# Patient Record
Sex: Female | Born: 1990 | Race: Black or African American | Hispanic: No | Marital: Single | Smoking: Never smoker
Health system: Southern US, Community
[De-identification: ages and names within clinical notes are randomized; demographics above are authoritative.]

## PROBLEM LIST (undated history)

## (undated) ENCOUNTER — Inpatient Hospital Stay (HOSPITAL_COMMUNITY): Payer: Self-pay

## (undated) DIAGNOSIS — R519 Headache, unspecified: Secondary | ICD-10-CM

## (undated) DIAGNOSIS — R51 Headache: Secondary | ICD-10-CM

## (undated) DIAGNOSIS — I10 Essential (primary) hypertension: Secondary | ICD-10-CM

## (undated) HISTORY — PX: TOOTH EXTRACTION: SHX859

---
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
## 2012-02-19 NOTE — L&D Delivery Note (Signed)
Whitney Todd is a 22 y.o. G2P1001 at [redacted]w[redacted]d presenting for IOL for GHTN.  She received cytotec, foley bulb and pitocin and progressed to complete dilation.  Delivery Note At 2:59 PM, after pushing through 4 contractions, a viable female was delivered via Vaginal, Spontaneous Delivery (Presentation: Left Occiput Anterior).  APGAR: 8, 9; weight .   Placenta status: Intact, Spontaneous.  Cord: 3 vessels with the following complications: None.  Cord pH: n./a  Anesthesia: Epidural  Episiotomy: n/a Lacerations: none Suture Repair: n/a Est. Blood Loss (mL): 500  Mom to postpartum.  Baby to nursery-stable.  Napoleon Form 06/12/2012, 3:12 PM

## 2012-04-13 ENCOUNTER — Inpatient Hospital Stay (HOSPITAL_COMMUNITY)
Admission: AD | Admit: 2012-04-13 | Discharge: 2012-04-13 | Disposition: A | Discharge status: Home / Self Care | Payer: Self-pay | Source: Ambulatory Visit | Attending: Obstetrics & Gynecology | Admitting: Obstetrics & Gynecology

## 2012-04-13 ENCOUNTER — Encounter (HOSPITAL_COMMUNITY): Payer: Self-pay | Admitting: *Deleted

## 2012-04-13 DIAGNOSIS — O26859 Spotting complicating pregnancy, unspecified trimester: Secondary | ICD-10-CM | POA: Insufficient documentation

## 2012-04-13 DIAGNOSIS — R109 Unspecified abdominal pain: Secondary | ICD-10-CM

## 2012-04-13 DIAGNOSIS — O0930 Supervision of pregnancy with insufficient antenatal care, unspecified trimester: Secondary | ICD-10-CM

## 2012-04-13 DIAGNOSIS — O9989 Other specified diseases and conditions complicating pregnancy, childbirth and the puerperium: Secondary | ICD-10-CM

## 2012-04-13 DIAGNOSIS — O26899 Other specified pregnancy related conditions, unspecified trimester: Secondary | ICD-10-CM

## 2012-04-13 DIAGNOSIS — Z3480 Encounter for supervision of other normal pregnancy, unspecified trimester: Secondary | ICD-10-CM

## 2012-04-13 LAB — URINALYSIS, ROUTINE W REFLEX MICROSCOPIC
Ketones, ur: NEGATIVE mg/dL
Nitrite: NEGATIVE
Protein, ur: NEGATIVE mg/dL
Urobilinogen, UA: 1 mg/dL (ref 0.0–1.0)
pH: 7 (ref 5.0–8.0)

## 2012-04-13 MED ORDER — RANITIDINE HCL 150 MG PO TABS: 150.0000 mg | ORAL_TABLET | Freq: Two times a day (BID) | ORAL | Status: DC

## 2012-04-13 NOTE — MAU Provider Note (Signed)
History     CSN: [id number]  Arrival date and time: 04/13/12 1746   None     Chief Complaint  Patient presents with  . Possible Pregnancy  . Abdominal Pain   HPI 22 y.o. G2P1001 at [redacted]w[redacted]d. Irregular periods x 4 months, upper abd pain x 4 months. Spotting since August, had some spotting early this month x  1-2 days. No spotting in a few weeks, no pain today. Says she did not know she was pregnant.    History reviewed. No pertinent past medical history.  History reviewed. No pertinent past surgical history.  Family History  Problem Relation Age of Onset  . Hypertension Mother   . Diabetes Father   . Diabetes Paternal Grandmother     History  Substance Use Topics  . Smoking status: Never Smoker   . Smokeless tobacco: Not on file  . Alcohol Use: No    Allergies: No Known Allergies  Prescriptions prior to admission  Medication Sig Dispense Refill  . acetaminophen (TYLENOL) 500 MG tablet Take 1,000 mg by mouth every 6 (six) hours as needed. For pain      . cyclobenzaprine (FLEXERIL) 10 MG tablet Take 1 tablet (10 mg total) by mouth 2 (two) times daily as needed for muscle spasms.  20 tablet  0    Review of Systems  Constitutional: Negative.   Respiratory: Negative.   Cardiovascular: Negative.   Gastrointestinal: Positive for abdominal pain (upper). Negative for nausea, vomiting, diarrhea and constipation.  Genitourinary: Negative for dysuria, urgency, frequency, hematuria and flank pain.       Negative for vaginal bleeding, cramping/contractions  Musculoskeletal: Negative.   Neurological: Negative.   Psychiatric/Behavioral: Negative.    Physical Exam   Blood pressure 138/96, pulse 89, resp. rate 20, height 5\' 10"  (1.778 m), weight 249 lb 6.4 oz (113.127 kg), last menstrual period 09/22/2011, SpO2 100.00%.  Physical Exam  Nursing note and vitals reviewed. Constitutional: She is oriented to person, place, and time. She appears well-developed and well-nourished. No  distress.  Cardiovascular: Normal rate.   Respiratory: Effort normal.  GI: Soft. She exhibits no mass. There is no tenderness. There is no rebound and no guarding.  Fundal height - 28 weeks   Musculoskeletal: Normal range of motion.  Neurological: She is alert and oriented to person, place, and time.  Skin: Skin is warm and dry.  Psychiatric: She has a normal mood and affect.    MAU Course  Procedures Results for orders placed during the hospital encounter of 04/13/12 (from the past 24 hour(s))  URINALYSIS, ROUTINE W REFLEX MICROSCOPIC     Status: Abnormal   Collection Time    04/13/12  6:55 PM      Result Value Range   Color, Urine YELLOW  YELLOW   APPearance CLEAR  CLEAR   Specific Gravity, Urine 1.015  1.005 - 1.030   pH 7.0  5.0 - 8.0   Glucose, UA NEGATIVE  NEGATIVE mg/dL   Hgb urine dipstick NEGATIVE  NEGATIVE   Bilirubin Urine NEGATIVE  NEGATIVE   Ketones, ur NEGATIVE  NEGATIVE mg/dL   Protein, ur NEGATIVE  NEGATIVE mg/dL   Urobilinogen, UA 1.0  0.0 - 1.0 mg/dL   Nitrite NEGATIVE  NEGATIVE   Leukocytes, UA MODERATE (*) NEGATIVE  URINE MICROSCOPIC-ADD ON     Status: Abnormal   Collection Time    04/13/12  6:55 PM      Result Value Range   Squamous Epithelial / LPF FEW (*) RARE  WBC, UA 3-6  <3 WBC/hpf   RBC / HPF 3-6  <3 RBC/hpf   Bacteria, UA FEW (*) RARE  POCT PREGNANCY, URINE     Status: Abnormal   Collection Time    04/13/12  7:08 PM      Result Value Range   Preg Test, Ur POSITIVE (*) NEGATIVE   Urine culture sent  Assessment and Plan  22 y.o. G2P1001 at [redacted]w[redacted]d H/O spotting this pregnancy - none x a few weeks Offered limited u/s during MAU stay, but patient opted for outpatient u/s d/t long wait Precautions rev'd Detailed u/s ordered Message sent to clinic to schedule New OB  FRAZIER,NATALIE 04/13/2012, 9:15 PM

## 2012-04-13 NOTE — MAU Note (Signed)
Patient states she has had irregular periods for about 4 months. Has been having upper abdominal pain for the same 4 months but patient states not related to periods. Denies any bleeding or discharge.

## 2012-04-13 NOTE — MAU Note (Signed)
Pt presents with abdominal pain that started approximately 4months ago.  Last normal period was in August.  Since then she has been having some light spotting since then.

## 2012-04-14 LAB — URINE CULTURE: Colony Count: 6000

## 2012-04-16 ENCOUNTER — Ambulatory Visit (HOSPITAL_COMMUNITY)
Admission: RE | Admit: 2012-04-16 | Discharge: 2012-04-16 | Disposition: A | Discharge status: Home / Self Care | Payer: Self-pay | Source: Ambulatory Visit | Attending: Advanced Practice Midwife | Admitting: Advanced Practice Midwife

## 2012-04-16 DIAGNOSIS — Z363 Encounter for antenatal screening for malformations: Secondary | ICD-10-CM | POA: Insufficient documentation

## 2012-04-16 DIAGNOSIS — Z1389 Encounter for screening for other disorder: Secondary | ICD-10-CM | POA: Insufficient documentation

## 2012-04-16 DIAGNOSIS — O0930 Supervision of pregnancy with insufficient antenatal care, unspecified trimester: Secondary | ICD-10-CM | POA: Insufficient documentation

## 2012-04-16 DIAGNOSIS — O358XX0 Maternal care for other (suspected) fetal abnormality and damage, not applicable or unspecified: Secondary | ICD-10-CM | POA: Insufficient documentation

## 2012-04-16 DIAGNOSIS — R109 Unspecified abdominal pain: Secondary | ICD-10-CM

## 2012-04-16 DIAGNOSIS — O209 Hemorrhage in early pregnancy, unspecified: Secondary | ICD-10-CM | POA: Insufficient documentation

## 2012-04-17 DIAGNOSIS — Z3480 Encounter for supervision of other normal pregnancy, unspecified trimester: Secondary | ICD-10-CM

## 2012-04-17 DIAGNOSIS — O0930 Supervision of pregnancy with insufficient antenatal care, unspecified trimester: Secondary | ICD-10-CM

## 2012-04-27 ENCOUNTER — Ambulatory Visit (INDEPENDENT_AMBULATORY_CARE_PROVIDER_SITE_OTHER): Payer: Self-pay | Admitting: Advanced Practice Midwife

## 2012-04-27 ENCOUNTER — Other Ambulatory Visit: Payer: Self-pay | Admitting: Advanced Practice Midwife

## 2012-04-27 ENCOUNTER — Encounter: Payer: Self-pay | Admitting: Family Medicine

## 2012-04-27 VITALS — BP 151/94 | Temp 97.2°F | Wt 247.0 lb

## 2012-04-27 DIAGNOSIS — O09293 Supervision of pregnancy with other poor reproductive or obstetric history, third trimester: Secondary | ICD-10-CM

## 2012-04-27 DIAGNOSIS — O98319 Other infections with a predominantly sexual mode of transmission complicating pregnancy, unspecified trimester: Secondary | ICD-10-CM

## 2012-04-27 DIAGNOSIS — A749 Chlamydial infection, unspecified: Secondary | ICD-10-CM

## 2012-04-27 DIAGNOSIS — O09299 Supervision of pregnancy with other poor reproductive or obstetric history, unspecified trimester: Secondary | ICD-10-CM | POA: Insufficient documentation

## 2012-04-27 DIAGNOSIS — O0933 Supervision of pregnancy with insufficient antenatal care, third trimester: Secondary | ICD-10-CM

## 2012-04-27 DIAGNOSIS — O0930 Supervision of pregnancy with insufficient antenatal care, unspecified trimester: Secondary | ICD-10-CM

## 2012-04-27 LAB — POCT URINALYSIS DIP (DEVICE)
Bilirubin Urine: NEGATIVE
Glucose, UA: NEGATIVE mg/dL
Hgb urine dipstick: NEGATIVE
Ketones, ur: NEGATIVE mg/dL
Nitrite: NEGATIVE
Specific Gravity, Urine: 1.025 (ref 1.005–1.030)
pH: 7 (ref 5.0–8.0)

## 2012-04-27 NOTE — Patient Instructions (Addendum)
Pregnancy - Third Trimester The third trimester of pregnancy (the last 3 months) is a period of the most rapid growth for you and your baby. The baby approaches a length of 20 inches and a weight of 6 to 10 pounds. The baby is adding on fat and getting ready for life outside your body. While inside, babies have periods of sleeping and waking, suck their thumbs, and hiccups. You can often feel small contractions of the uterus. This is false labor. It is also called Braxton-Hicks contractions. This is like a practice for labor. The usual problems in this stage of pregnancy include more difficulty breathing, swelling of the hands and feet from water retention, and having to urinate more often because of the uterus and baby pressing on your bladder.  PRENATAL EXAMS  Blood work may continue to be done during prenatal exams. These tests are done to check on your health and the probable health of your baby. Blood work is used to follow your blood levels (hemoglobin). Anemia (low hemoglobin) is common during pregnancy. Iron and vitamins are given to help prevent this. You may also continue to be checked for diabetes. Some of the past blood tests may be done again.  The size of the uterus is measured during each visit. This makes sure your baby is growing properly according to your pregnancy dates.  Your blood pressure is checked every prenatal visit. This is to make sure you are not getting toxemia.  Your urine is checked every prenatal visit for infection, diabetes and protein.  Your weight is checked at each visit. This is done to make sure gains are happening at the suggested rate and that you and your baby are growing normally.  Sometimes, an ultrasound is performed to confirm the position and the proper growth and development of the baby. This is a test done that bounces harmless sound waves off the baby so your caregiver can more accurately determine due dates.  Discuss the type of pain medication and  anesthesia you will have during your labor and delivery.  Discuss the possibility and anesthesia if a Cesarean Section might be necessary.  Inform your caregiver if there is any mental or physical violence at home. Sometimes, a specialized non-stress test, contraction stress test and biophysical profile are done to make sure the baby is not having a problem. Checking the amniotic fluid surrounding the baby is called an amniocentesis. The amniotic fluid is removed by sticking a needle into the belly (abdomen). This is sometimes done near the end of pregnancy if an early delivery is required. In this case, it is done to help make sure the baby's lungs are mature enough for the baby to live outside of the womb. If the lungs are not mature and it is unsafe to deliver the baby, an injection of cortisone medication is given to the mother 1 to 2 days before the delivery. This helps the baby's lungs mature and makes it safer to deliver the baby. CHANGES OCCURING IN THE THIRD TRIMESTER OF PREGNANCY Your body goes through many changes during pregnancy. They vary from person to person. Talk to your caregiver about changes you notice and are concerned about.  During the last trimester, you have probably had an increase in your appetite. It is normal to have cravings for certain foods. This varies from person to person and pregnancy to pregnancy.  You may begin to get stretch marks on your hips, abdomen, and breasts. These are normal changes in the body   during pregnancy. There are no exercises or medications to take which prevent this change.  Constipation may be treated with a stool softener or adding bulk to your diet. Drinking lots of fluids, fiber in vegetables, fruits, and whole grains are helpful.  Exercising is also helpful. If you have been very active up until your pregnancy, most of these activities can be continued during your pregnancy. If you have been less active, it is helpful to start an exercise  program such as walking. Consult your caregiver before starting exercise programs.  Avoid all smoking, alcohol, un-prescribed drugs, herbs and "street drugs" during your pregnancy. These chemicals affect the formation and growth of the baby. Avoid chemicals throughout the pregnancy to ensure the delivery of a healthy infant.  Backache, varicose veins and hemorrhoids may develop or get worse.  You will tire more easily in the third trimester, which is normal.  The baby's movements may be stronger and more often.  You may become short of breath easily.  Your belly button may stick out.  A yellow discharge may leak from your breasts called colostrum.  You may have a bloody mucus discharge. This usually occurs a few days to a week before labor begins. HOME CARE INSTRUCTIONS   Keep your caregiver's appointments. Follow your caregiver's instructions regarding medication use, exercise, and diet.  During pregnancy, you are providing food for you and your baby. Continue to eat regular, well-balanced meals. Choose foods such as meat, fish, milk and other low fat dairy products, vegetables, fruits, and whole-grain breads and cereals. Your caregiver will tell you of the ideal weight gain.  A physical sexual relationship may be continued throughout pregnancy if there are no other problems such as early (premature) leaking of amniotic fluid from the membranes, vaginal bleeding, or belly (abdominal) pain.  Exercise regularly if there are no restrictions. Check with your caregiver if you are unsure of the safety of your exercises. Greater weight gain will occur in the last 2 trimesters of pregnancy. Exercising helps:  Control your weight.  Get you in shape for labor and delivery.  You lose weight after you deliver.  Rest a lot with legs elevated, or as needed for leg cramps or low back pain.  Wear a good support or jogging bra for breast tenderness during pregnancy. This may help if worn during  sleep. Pads or tissues may be used in the bra if you are leaking colostrum.  Do not use hot tubs, steam rooms, or saunas.  Wear your seat belt when driving. This protects you and your baby if you are in an accident.  Avoid raw meat, cat litter boxes and soil used by cats. These carry germs that can cause birth defects in the baby.  It is easier to loose urine during pregnancy. Tightening up and strengthening the pelvic muscles will help with this problem. You can practice stopping your urination while you are going to the bathroom. These are the same muscles you need to strengthen. It is also the muscles you would use if you were trying to stop from passing gas. You can practice tightening these muscles up 10 times a set and repeating this about 3 times per day. Once you know what muscles to tighten up, do not perform these exercises during urination. It is more likely to cause an infection by backing up the urine.  Ask for help if you have financial, counseling or nutritional needs during pregnancy. Your caregiver will be able to offer counseling for these   needs as well as refer you for other special needs.  Make a list of emergency phone numbers and have them available.  Plan on getting help from family or friends when you go home from the hospital.  Make a trial run to the hospital.  Take prenatal classes with the father to understand, practice and ask questions about the labor and delivery.  Prepare the baby's room/nursery.  Do not travel out of the city unless it is absolutely necessary and with the advice of your caregiver.  Wear only low or no heal shoes to have better balance and prevent falling. MEDICATIONS AND DRUG USE IN PREGNANCY  Take prenatal vitamins as directed. The vitamin should contain 1 milligram of folic acid. Keep all vitamins out of reach of children. Only a couple vitamins or tablets containing iron may be fatal to a baby or young child when ingested.  Avoid use  of all medications, including herbs, over-the-counter medications, not prescribed or suggested by your caregiver. Only take over-the-counter or prescription medicines for pain, discomfort, or fever as directed by your caregiver. Do not use aspirin, ibuprofen (Motrin, Advil, Nuprin) or naproxen (Aleve) unless OK'd by your caregiver.  Let your caregiver also know about herbs you may be using.  Alcohol is related to a number of birth defects. This includes fetal alcohol syndrome. All alcohol, in any form, should be avoided completely. Smoking will cause low birth rate and premature babies.  Street/illegal drugs are very harmful to the baby. They are absolutely forbidden. A baby born to an addicted mother will be addicted at birth. The baby will go through the same withdrawal an adult does. SEEK MEDICAL CARE IF: You have any concerns or worries during your pregnancy. It is better to call with your questions if you feel they cannot wait, rather than worry about them. DECISIONS ABOUT CIRCUMCISION You may or may not know the sex of your baby. If you know your baby is a boy, it may be time to think about circumcision. Circumcision is the removal of the foreskin of the penis. This is the skin that covers the sensitive end of the penis. There is no proven medical need for this. Often this decision is made on what is popular at the time or based upon religious beliefs and social issues. You can discuss these issues with your caregiver or pediatrician. SEEK IMMEDIATE MEDICAL CARE IF:   An unexplained oral temperature above 102 F (38.9 C) develops, or as your caregiver suggests.  You have leaking of fluid from the vagina (birth canal). If leaking membranes are suspected, take your temperature and tell your caregiver of this when you call.  There is vaginal spotting, bleeding or passing clots. Tell your caregiver of the amount and how many pads are used.  You develop a bad smelling vaginal discharge with  a change in the color from clear to white.  You develop vomiting that lasts more than 24 hours.  You develop chills or fever.  You develop shortness of breath.  You develop burning on urination.  You loose more than 2 pounds of weight or gain more than 2 pounds of weight or as suggested by your caregiver.  You notice sudden swelling of your face, hands, and feet or legs.  You develop belly (abdominal) pain. Round ligament discomfort is a common non-cancerous (benign) cause of abdominal pain in pregnancy. Your caregiver still must evaluate you.  You develop a severe headache that does not go away.  You develop visual   problems, blurred or double vision.  If you have not felt your baby move for more than 1 hour. If you think the baby is not moving as much as usual, eat something with sugar in it and lie down on your left side for an hour. The baby should move at least 4 to 5 times per hour. Call right away if your baby moves less than that.  You fall, are in a car accident or any kind of trauma.  There is mental or physical violence at home. Document Released: 01/29/2001 Document Revised: 04/29/2011 Document Reviewed: 08/03/2008 [employer] Patient Information 2013 [employer].  Hypertension During Pregnancy Hypertension is also called high blood pressure. It can occur at any time in life and during pregnancy. When you have hypertension, there is extra pressure inside your blood vessels that carry blood from the heart to the rest of your body (arteries). Hypertension during pregnancy can cause problems for you and your baby. Your baby might not weigh as much as it should at birth or might be born early (premature). Very bad cases of hypertension during pregnancy can be life-threatening.  There are different types of hypertension during pregnancy.   Chronic hypertension. This happens when a woman has hypertension before pregnancy and it continues during pregnancy.  Gestational  hypertension. This is when hypertension develops during pregnancy.  Preeclampsia or toxemia of pregnancy. This is a very serious type of hypertension that develops only during pregnancy. It is a disease that affects the whole body (systemic) and can be very dangerous for both mother and baby.  Gestational hypertension and preeclampsia usually go away after your baby is born. Blood pressure generally stabilizes within 6 weeks. Women who have hypertension during pregnancy have a greater chance of developing hypertension later in life or with future pregnancies. UNDERSTANDING BLOOD PRESSURE Blood pressure moves blood in your body. Sometimes, the force that moves the blood becomes too strong.  A blood pressure reading is given in 2 numbers and looks like a fraction.  The top number is called the systolic pressure. When your heart beats, it forces more blood to flow through the arteries. Pressure inside the arteries goes up.  The bottom number is the diastolic pressure. Pressure goes down between beats. That is when the heart is resting.  You may have hypertension if:  Your systolic blood pressure is above 140.  Your diastolic pressure is above 90. RISK FACTORS Some factors make you more likely to develop hypertension during pregnancy. Risk factors include:  Having hypertension before pregnancy.  Having hypertension during a previous pregnancy.  Being overweight.  Being older than 40.  Being pregnant with more than 1 baby (multiples).  Having diabetes or kidney problems. SYMPTOMS Chronic and gestational hypertension may not cause symptoms. Preeclampsia has symptoms, which may include:  Increased protein in your urine. Your caregiver will check for this at every prenatal visit.  Swelling of your hands and face.  Rapid weight gain.  Headaches.  Visual changes.  Being bothered by light.  Abdominal pain, especially in the right upper area.  Chest pain.  Shortness of  breath.  Increased reflexes.  Seizures. Seizures occur with a more severe form of preeclampsia, called eclampsia. DIAGNOSIS   You may be diagnosed with hypertension during pregnancy during a regular prenatal exam. At each visit, tests may include:  Blood pressure checks.  A urine test to check for protein in your urine.  The type of hypertension you are diagnosed with depends on when you developed   it. It also depends on your specific blood pressure reading.  Developing hypertension before 20 weeks of pregnancy is consistent with chronic hypertension.  Developing hypertension after 20 weeks of pregnancy is consistent with gestational hypertension.  Hypertension with increased urinary protein is diagnosed as preeclampsia.  Blood pressure measurements that stay above 160 systolic or 110 diastolic are a sign of severe preeclampsia. TREATMENT Treatment for hypertension during pregnancy varies. Treatment depends on the type of hypertension and how serious it is.  If you take medicine for chronic hypertension, you may need to switch medicines.  Drugs called ACE inhibitors should not be taken during pregnancy.  Low-dose aspirin may be suggested for women who have risk factors for preeclampsia.  If you have gestational hypertension, you may need to take a blood pressure medicine that is safe during pregnancy. Your caregiver will recommend the appropriate medicine.  If you have severe preeclampsia, you may need to be in the hospital. Caregivers will watch you and the baby very closely. You also may need to take medicine (magnesium sulfate) to prevent seizures and lower blood pressure.  Sometimes an early delivery is needed. This may be the case if the condition worsens. It would be done to protect you and the baby. The only cure for preeclampsia is delivery. HOME CARE INSTRUCTIONS  Schedule and keep all of your regular prenatal care.  Follow your caregiver's instructions for taking  medicines. Tell your caregiver about all medicines you take. This includes over-the-counter medicines.  Eat as little salt as possible.  Get regular exercise.  Do not drink alcohol.  Do not use tobacco products.  Do not drink products with caffeine.  Lie on your left side when resting.  Tell your doctor if you have any preeclampsia symptoms. SEEK IMMEDIATE MEDICAL CARE IF:  You have severe abdominal pain.  You have sudden swelling in the hands, ankles, or face.  You gain 4 pounds (1.8 kg) or more in 1 week.  You vomit repeatedly.  You have vaginal bleeding.  You do not feel the baby moving as much.  You have a headache.  You have blurred or double vision.  You have muscle twitching or spasms.  You have shortness of breath.  You have blue fingernails and lips.  You have blood in your urine. MAKE SURE YOU:  Understand these instructions.  Will watch your condition.  Will get help right away if you are not doing well. Document Released: 10/23/2010 Document Revised: 04/29/2011 Document Reviewed: 10/23/2010 [employer] Patient Information 2013 [employer].  

## 2012-04-27 NOTE — Progress Notes (Signed)
Pulse 89 2nd BP 137/88

## 2012-04-27 NOTE — Progress Notes (Signed)
New OB today. See New OB note.  Hypertension noted today. PIH workup.  Subjective:    Whitney Todd is a G2P1001 [redacted]w[redacted]d being seen today for her first obstetrical visit.  Her obstetrical history is significant for obesity, pregnancy induced hypertension and late to care. Patient does intend to breast feed. Pregnancy history fully reviewed.  Patient reports no complaints and Denies headache or visual changes. Denies swelling except in toes at end of day.  Filed Vitals:   04/27/12 0818  BP: 151/94  Temp: 97.2 F (36.2 C)  Weight: 247 lb (112.038 kg)    HISTORY: OB History   Grav Para Term Preterm Abortions TAB SAB Ect Mult Living   2 1 1       1      # Outc Date GA Lbr Len/2nd Wgt Sex Del Anes PTL Lv   1 TRM 12/11   7lb12oz(3.515kg) F SVD EPI No    2 CUR              History reviewed. No pertinent past medical history. History reviewed. No pertinent past surgical history. Family History  Problem Relation Age of Onset  . Hypertension Mother   . Diabetes Father   . Diabetes Paternal Grandmother      Exam    Uterus:  Fundal Height: 31 cm  Pelvic Exam:    Perineum: No Hemorrhoids   Vulva: Bartholin's, Urethra, Skene's normal   Vagina:  normal mucosa, normal discharge   pH:    Cervix: multiparous appearance, no cervical motion tenderness and no lesions   Adnexa: normal adnexa and no mass, fullness, tenderness   Bony Pelvis: gynecoid  System: Breast:  normal appearance, no masses or tenderness   Skin: normal coloration and turgor, no rashes    Neurologic: oriented, no focal deficits, normal mood   Extremities: normal strength, tone, and muscle mass, no deformities   HEENT n/a   Mouth/Teeth mucous membranes moist, pharynx normal without lesions   Neck supple and no masses   Cardiovascular: regular rate and rhythm, no murmurs or gallops   Respiratory:  appears well, vitals normal, no respiratory distress, acyanotic, normal RR, ear and throat exam is normal, neck  free of mass or lymphadenopathy, chest clear, no wheezing, crepitations, rhonchi, normal symmetric air entry   Abdomen: soft, non-tender; bowel sounds normal; no masses,  no organomegaly   Urinary: urethral meatus normal   Fetal heart rate regular 136 bpm Fundal height c/w dates.   No edema  Assessment:    Pregnancy: G2P1001 Patient Active Problem List  Diagnosis  . Supervision of other normal pregnancy  . Insufficient prenatal care  . Hx of preeclampsia, prior pregnancy, currently pregnant  History of Chlamydia first pregnancy      Plan:     Initial labs drawn. Prenatal vitamins. Problem list reviewed and updated. Genetic Screening discussed First Screen: Too Late.  Ultrasound discussed; fetal survey: results reviewed.  Follow up in 1 weeks. 50% of 30 min visit spent on counseling and coordination of care.  Discussed signs of preeclampsia.  Reviewed how to come to get evaluated if she develops signs/sx.  Will get 24 hr urine and PIH labs with NOB labs today.    [facility] 04/27/2012

## 2012-04-27 NOTE — Addendum Note (Signed)
Addended by: Franchot Mimes on: 04/27/2012 09:16 AM   Modules accepted: Orders

## 2012-04-28 LAB — OBSTETRIC PANEL
Eosinophils Absolute: 0 10*3/uL (ref 0.0–0.7)
HCT: 27.9 % — ABNORMAL LOW (ref 36.0–46.0)
Hemoglobin: 9.3 g/dL — ABNORMAL LOW (ref 12.0–15.0)
Lymphs Abs: 2.5 10*3/uL (ref 0.7–4.0)
MCH: 30 pg (ref 26.0–34.0)
MCV: 90 fL (ref 78.0–100.0)
Monocytes Absolute: 0.5 10*3/uL (ref 0.1–1.0)
Monocytes Relative: 4 % (ref 3–12)
Neutrophils Relative %: 75 % (ref 43–77)
RBC: 3.1 MIL/uL — ABNORMAL LOW (ref 3.87–5.11)
Rubella: 1.66 Index — ABNORMAL HIGH (ref <0.90)

## 2012-04-28 LAB — GLUCOSE TOLERANCE, 1 HOUR (50G) W/O FASTING: Glucose, 1 Hour GTT: 107 mg/dL (ref 70–140)

## 2012-04-28 LAB — HIV ANTIBODY (ROUTINE TESTING W REFLEX): HIV: NONREACTIVE

## 2012-04-30 DIAGNOSIS — A749 Chlamydial infection, unspecified: Secondary | ICD-10-CM | POA: Insufficient documentation

## 2012-04-30 MED ORDER — METRONIDAZOLE 500 MG PO TABS: 500.0000 mg | ORAL_TABLET | Freq: Two times a day (BID) | ORAL | Status: DC

## 2012-04-30 MED ORDER — AZITHROMYCIN 1 G PO PACK: 1.0000 | PACK | Freq: Once | ORAL | Status: DC

## 2012-04-30 NOTE — Addendum Note (Signed)
Addended by: Aviva Signs on: 04/30/2012 03:07 PM   Modules accepted: Orders

## 2012-05-01 ENCOUNTER — Telehealth: Payer: Self-pay | Admitting: *Deleted

## 2012-05-01 NOTE — Telephone Encounter (Signed)
Message copied by Jill Side on Fri May 01, 2012 11:14 AM ------      Message from: [place], [place] L      Created: Thu Apr 30, 2012  3:04 PM      Regarding: Please add BV med       Sorry did not see this result too.            Will put in order for Flagyl for BV            Thanks ------

## 2012-05-01 NOTE — Telephone Encounter (Signed)
Called pt and informed her of +Chlamydia and BV which require Rx treatment. She stated that she has not had sex since Dec. 2013. I explained that she may have had the Chlamydia infection since that time. She will need to inform her partner so that he can be tested and treated. She should not have intercourse for 1 week after her treatment to guard against transmission of the infection.  Pt voiced understanding of all information and instructions and will obtain her medications from the pharmacy. STI form completed and faxed to [facility].

## 2012-05-01 NOTE — Telephone Encounter (Signed)
Message copied by Jill Side on Fri May 01, 2012 11:14 AM ------      Message from: [place], [place] L      Created: Thu Apr 30, 2012  3:03 PM      Regarding: Needs Chlam treatment       + Chlamydia at new ob visit            I put in order for Zithromax.            Can you let her know?      Thanks ------

## 2012-05-07 ENCOUNTER — Ambulatory Visit (INDEPENDENT_AMBULATORY_CARE_PROVIDER_SITE_OTHER): Payer: Self-pay | Admitting: Family

## 2012-05-07 ENCOUNTER — Other Ambulatory Visit: Payer: Self-pay | Admitting: Family

## 2012-05-07 VITALS — BP 134/88 | Temp 97.1°F | Wt 251.1 lb

## 2012-05-07 DIAGNOSIS — D649 Anemia, unspecified: Secondary | ICD-10-CM

## 2012-05-07 DIAGNOSIS — O0930 Supervision of pregnancy with insufficient antenatal care, unspecified trimester: Secondary | ICD-10-CM

## 2012-05-07 LAB — POCT URINALYSIS DIP (DEVICE)
Bilirubin Urine: NEGATIVE
Glucose, UA: NEGATIVE mg/dL
Hgb urine dipstick: NEGATIVE
Nitrite: NEGATIVE

## 2012-05-07 MED ORDER — INTEGRA F 125-1 MG PO CAPS: 1.0000 | ORAL_CAPSULE | Freq: Every day | ORAL | Status: DC

## 2012-05-07 NOTE — Addendum Note (Signed)
Addended by: Melissa Noon on: 05/07/2012 10:17 AM   Modules accepted: Orders

## 2012-05-07 NOTE — Progress Notes (Signed)
Pulse- 93  Edema-toes

## 2012-05-07 NOTE — Progress Notes (Signed)
Urine sent for culture

## 2012-05-07 NOTE — Progress Notes (Signed)
No problems or concerns; reviewed lab results. Obtain 24 hr urine and CMP as baseline due to history of preeclampsia.RX for Integra for anemia.

## 2012-05-08 LAB — CULTURE, OB URINE: Colony Count: >=100000

## 2012-05-11 ENCOUNTER — Other Ambulatory Visit: Payer: Self-pay

## 2012-05-21 ENCOUNTER — Other Ambulatory Visit: Payer: Self-pay | Admitting: Family Medicine

## 2012-05-21 ENCOUNTER — Ambulatory Visit (INDEPENDENT_AMBULATORY_CARE_PROVIDER_SITE_OTHER): Payer: Self-pay | Admitting: Family Medicine

## 2012-05-21 ENCOUNTER — Encounter: Payer: Self-pay | Admitting: Family Medicine

## 2012-05-21 VITALS — BP 138/90 | Temp 96.9°F | Wt 249.6 lb

## 2012-05-21 DIAGNOSIS — O0930 Supervision of pregnancy with insufficient antenatal care, unspecified trimester: Secondary | ICD-10-CM

## 2012-05-21 DIAGNOSIS — R8281 Pyuria: Secondary | ICD-10-CM | POA: Insufficient documentation

## 2012-05-21 DIAGNOSIS — R82998 Other abnormal findings in urine: Secondary | ICD-10-CM

## 2012-05-21 LAB — POCT URINALYSIS DIP (DEVICE)
Nitrite: POSITIVE — AB
Protein, ur: 100 mg/dL — AB
Urobilinogen, UA: 4 mg/dL — ABNORMAL HIGH (ref 0.0–1.0)
pH: 7 (ref 5.0–8.0)

## 2012-05-21 NOTE — Patient Instructions (Signed)
Pregnancy - Third Trimester The third trimester of pregnancy (the last 3 months) is a period of the most rapid growth for you and your baby. The baby approaches a length of 20 inches and a weight of 6 to 10 pounds. The baby is adding on fat and getting ready for life outside your body. While inside, babies have periods of sleeping and waking, suck their thumbs, and hiccups. You can often feel small contractions of the uterus. This is false labor. It is also called Braxton-Hicks contractions. This is like a practice for labor. The usual problems in this stage of pregnancy include more difficulty breathing, swelling of the hands and feet from water retention, and having to urinate more often because of the uterus and baby pressing on your bladder.  PRENATAL EXAMS  Blood work may continue to be done during prenatal exams. These tests are done to check on your health and the probable health of your baby. Blood work is used to follow your blood levels (hemoglobin). Anemia (low hemoglobin) is common during pregnancy. Iron and vitamins are given to help prevent this. You may also continue to be checked for diabetes. Some of the past blood tests may be done again.  The size of the uterus is measured during each visit. This makes sure your baby is growing properly according to your pregnancy dates.  Your blood pressure is checked every prenatal visit. This is to make sure you are not getting toxemia.  Your urine is checked every prenatal visit for infection, diabetes and protein.  Your weight is checked at each visit. This is done to make sure gains are happening at the suggested rate and that you and your baby are growing normally.  Sometimes, an ultrasound is performed to confirm the position and the proper growth and development of the baby. This is a test done that bounces harmless sound waves off the baby so your caregiver can more accurately determine due dates.  Discuss the type of pain medication  and anesthesia you will have during your labor and delivery.  Discuss the possibility and anesthesia if a Cesarean Section might be necessary.  Inform your caregiver if there is any mental or physical violence at home. Sometimes, a specialized non-stress test, contraction stress test and biophysical profile are done to make sure the baby is not having a problem. Checking the amniotic fluid surrounding the baby is called an amniocentesis. The amniotic fluid is removed by sticking a needle into the belly (abdomen). This is sometimes done near the end of pregnancy if an early delivery is required. In this case, it is done to help make sure the baby's lungs are mature enough for the baby to live outside of the womb. If the lungs are not mature and it is unsafe to deliver the baby, an injection of cortisone medication is given to the mother 1 to 2 days before the delivery. This helps the baby's lungs mature and makes it safer to deliver the baby. CHANGES OCCURING IN THE THIRD TRIMESTER OF PREGNANCY Your body goes through many changes during pregnancy. They vary from person to person. Talk to your caregiver about changes you notice and are concerned about.  During the last trimester, you have probably had an increase in your appetite. It is normal to have cravings for certain foods. This varies from person to person and pregnancy to pregnancy.  You may begin to get stretch marks on your hips, abdomen, and breasts. These are normal changes in the body   during pregnancy. There are no exercises or medications to take which prevent this change.  Constipation may be treated with a stool softener or adding bulk to your diet. Drinking lots of fluids, fiber in vegetables, fruits, and whole grains are helpful.  Exercising is also helpful. If you have been very active up until your pregnancy, most of these activities can be continued during your pregnancy. If you have been less active, it is helpful to start an  exercise program such as walking. Consult your caregiver before starting exercise programs.  Avoid all smoking, alcohol, un-prescribed drugs, herbs and "street drugs" during your pregnancy. These chemicals affect the formation and growth of the baby. Avoid chemicals throughout the pregnancy to ensure the delivery of a healthy infant.  Backache, varicose veins and hemorrhoids may develop or get worse.  You will tire more easily in the third trimester, which is normal.  The baby's movements may be stronger and more often.  You may become short of breath easily.  Your belly button may stick out.  A yellow discharge may leak from your breasts called colostrum.  You may have a bloody mucus discharge. This usually occurs a few days to a week before labor begins. HOME CARE INSTRUCTIONS   Keep your caregiver's appointments. Follow your caregiver's instructions regarding medication use, exercise, and diet.  During pregnancy, you are providing food for you and your baby. Continue to eat regular, well-balanced meals. Choose foods such as meat, fish, milk and other low fat dairy products, vegetables, fruits, and whole-grain breads and cereals. Your caregiver will tell you of the ideal weight gain.  A physical sexual relationship may be continued throughout pregnancy if there are no other problems such as early (premature) leaking of amniotic fluid from the membranes, vaginal bleeding, or belly (abdominal) pain.  Exercise regularly if there are no restrictions. Check with your caregiver if you are unsure of the safety of your exercises. Greater weight gain will occur in the last 2 trimesters of pregnancy. Exercising helps:  Control your weight.  Get you in shape for labor and delivery.  You lose weight after you deliver.  Rest a lot with legs elevated, or as needed for leg cramps or low back pain.  Wear a good support or jogging bra for breast tenderness during pregnancy. This may help if worn  during sleep. Pads or tissues may be used in the bra if you are leaking colostrum.  Do not use hot tubs, steam rooms, or saunas.  Wear your seat belt when driving. This protects you and your baby if you are in an accident.  Avoid raw meat, cat litter boxes and soil used by cats. These carry germs that can cause birth defects in the baby.  It is easier to loose urine during pregnancy. Tightening up and strengthening the pelvic muscles will help with this problem. You can practice stopping your urination while you are going to the bathroom. These are the same muscles you need to strengthen. It is also the muscles you would use if you were trying to stop from passing gas. You can practice tightening these muscles up 10 times a set and repeating this about 3 times per day. Once you know what muscles to tighten up, do not perform these exercises during urination. It is more likely to cause an infection by backing up the urine.  Ask for help if you have financial, counseling or nutritional needs during pregnancy. Your caregiver will be able to offer counseling for these   needs as well as refer you for other special needs.  Make a list of emergency phone numbers and have them available.  Plan on getting help from family or friends when you go home from the hospital.  Make a trial run to the hospital.  Take prenatal classes with the father to understand, practice and ask questions about the labor and delivery.  Prepare the baby's room/nursery.  Do not travel out of the city unless it is absolutely necessary and with the advice of your caregiver.  Wear only low or no heal shoes to have better balance and prevent falling. MEDICATIONS AND DRUG USE IN PREGNANCY  Take prenatal vitamins as directed. The vitamin should contain 1 milligram of folic acid. Keep all vitamins out of reach of children. Only a couple vitamins or tablets containing iron may be fatal to a baby or young child when  ingested.  Avoid use of all medications, including herbs, over-the-counter medications, not prescribed or suggested by your caregiver. Only take over-the-counter or prescription medicines for pain, discomfort, or fever as directed by your caregiver. Do not use aspirin, ibuprofen (Motrin, Advil, Nuprin) or naproxen (Aleve) unless OK'd by your caregiver.  Let your caregiver also know about herbs you may be using.  Alcohol is related to a number of birth defects. This includes fetal alcohol syndrome. All alcohol, in any form, should be avoided completely. Smoking will cause low birth rate and premature babies.  Street/illegal drugs are very harmful to the baby. They are absolutely forbidden. A baby born to an addicted mother will be addicted at birth. The baby will go through the same withdrawal an adult does. SEEK MEDICAL CARE IF: You have any concerns or worries during your pregnancy. It is better to call with your questions if you feel they cannot wait, rather than worry about them. DECISIONS ABOUT CIRCUMCISION You may or may not know the sex of your baby. If you know your baby is a boy, it may be time to think about circumcision. Circumcision is the removal of the foreskin of the penis. This is the skin that covers the sensitive end of the penis. There is no proven medical need for this. Often this decision is made on what is popular at the time or based upon religious beliefs and social issues. You can discuss these issues with your caregiver or pediatrician. SEEK IMMEDIATE MEDICAL CARE IF:   An unexplained oral temperature above 102 F (38.9 C) develops, or as your caregiver suggests.  You have leaking of fluid from the vagina (birth canal). If leaking membranes are suspected, take your temperature and tell your caregiver of this when you call.  There is vaginal spotting, bleeding or passing clots. Tell your caregiver of the amount and how many pads are used.  You develop a bad smelling  vaginal discharge with a change in the color from clear to white.  You develop vomiting that lasts more than 24 hours.  You develop chills or fever.  You develop shortness of breath.  You develop burning on urination.  You loose more than 2 pounds of weight or gain more than 2 pounds of weight or as suggested by your caregiver.  You notice sudden swelling of your face, hands, and feet or legs.  You develop belly (abdominal) pain. Round ligament discomfort is a common non-cancerous (benign) cause of abdominal pain in pregnancy. Your caregiver still must evaluate you.  You develop a severe headache that does not go away.  You develop visual   problems, blurred or double vision.  If you have not felt your baby move for more than 1 hour. If you think the baby is not moving as much as usual, eat something with sugar in it and lie down on your left side for an hour. The baby should move at least 4 to 5 times per hour. Call right away if your baby moves less than that.  You fall, are in a car accident or any kind of trauma.  There is mental or physical violence at home. Document Released: 01/29/2001 Document Revised: 04/29/2011 Document Reviewed: 08/03/2008 [employer] Patient Information 2013 [employer].  Breastfeeding Deciding to breastfeed is one of the best choices you can make for you and your baby. The information that follows gives a brief overview of the benefits of breastfeeding as well as common topics surrounding breastfeeding. BENEFITS OF BREASTFEEDING For the baby  The first milk (colostrum) helps the baby's digestive system function better.   There are antibodies in the mother's milk that help the baby fight off infections.   The baby has a lower incidence of asthma, allergies, and sudden infant death syndrome (SIDS).   The nutrients in breast milk are better for the baby than infant formulas, and breast milk helps the baby's brain grow better.   Babies who  breastfeed have less gas, colic, and constipation.  For the mother  Breastfeeding helps develop a very special bond between the mother and her baby.   Breastfeeding is convenient, always available at the correct temperature, and costs nothing.   Breastfeeding burns calories in the mother and helps her lose weight that was gained during pregnancy.   Breastfeeding makes the uterus contract back down to normal size faster and slows bleeding following delivery.   Breastfeeding mothers have a lower risk of developing breast cancer.  BREASTFEEDING FREQUENCY  A healthy, full-term baby may breastfeed as often as every hour or space his or her feedings to every 3 hours.   Watch your baby for signs of hunger. Nurse your baby if he or she shows signs of hunger. How often you nurse will vary from baby to baby.   Nurse as often as the baby requests, or when you feel the need to reduce the fullness of your breasts.   Awaken the baby if it has been 3 4 hours since the last feeding.   Frequent feeding will help the mother make more milk and will help prevent problems, such as sore nipples and engorgement of the breasts.  BABY'S POSITION AT THE BREAST  Whether lying down or sitting, be sure that the baby's tummy is facing your tummy.   Support the breast with 4 fingers underneath the breast and the thumb above. Make sure your fingers are well away from the nipple and baby's mouth.   Stroke the baby's lips gently with your finger or nipple.   When the baby's mouth is open wide enough, place all of your nipple and as much of the areola as possible into your baby's mouth.   Pull the baby in close so the tip of the nose and the baby's cheeks touch the breast during the feeding.  FEEDINGS AND SUCTION  The length of each feeding varies from baby to baby and from feeding to feeding.   The baby must suck about 2 3 minutes for your milk to get to him or her. This is called a "let down."  For this reason, allow the baby to feed on each breast as   long as he or she wants. Your baby will end the feeding when he or she has received the right balance of nutrients.   To break the suction, put your finger into the corner of the baby's mouth and slide it between his or her gums before removing your breast from his or her mouth. This will help prevent sore nipples.  HOW TO TELL WHETHER YOUR BABY IS GETTING ENOUGH BREAST MILK. Wondering whether or not your baby is getting enough milk is a common concern among mothers. You can be assured that your baby is getting enough milk if:   Your baby is actively sucking and you hear swallowing.   Your baby seems relaxed and satisfied after a feeding.   Your baby nurses at least 8 12 times in a 24 hour time period. Nurse your baby until he or she unlatches or falls asleep at the first breast (at least 10 20 minutes), then offer the second side.   Your baby is wetting 5 6 disposable diapers (6 8 cloth diapers) in a 24 hour period by 5 6 days of age.   Your baby is having at least 3 4 stools every 24 hours for the first 6 weeks. The stool should be soft and yellow.   Your baby should gain 4 7 ounces per week after he or she is 4 days old.   Your breasts feel softer after nursing.  REDUCING BREAST ENGORGEMENT  In the first week after your baby is born, you may experience signs of breast engorgement. When breasts are engorged, they feel heavy, warm, full, and may be tender to the touch. You can reduce engorgement if you:   Nurse frequently, every 2 3 hours. Mothers who breastfeed early and often have fewer problems with engorgement.   Place light ice packs on your breasts for 10 20 minutes between feedings. This reduces swelling. Wrap the ice packs in a lightweight towel to protect your skin. Bags of frozen vegetables work well for this purpose.   Take a warm shower or apply warm, moist heat to your breast for 5 10 minutes just before  each feeding. This increases circulation and helps the milk flow.   Gently massage your breast before and during the feeding. Using your finger tips, massage from the chest wall towards your nipple in a circular motion.   Make sure that the baby empties at least one breast at every feeding before switching sides.   Use a breast pump to empty the breasts if your baby is sleepy or not nursing well. You may also want to pump if you are returning to work oryou feel you are getting engorged.   Avoid bottle feeds, pacifiers, or supplemental feedings of water or juice in place of breastfeeding. Breast milk is all the food your baby needs. It is not necessary for your baby to have water or formula. In fact, to help your breasts make more milk, it is best not to give your baby supplemental feedings during the early weeks.   Be sure the baby is latched on and positioned properly while breastfeeding.   Wear a supportive bra, avoiding underwire styles.   Eat a balanced diet with enough fluids.   Rest often, relax, and take your prenatal vitamins to prevent fatigue, stress, and anemia.  If you follow these suggestions, your engorgement should improve in 24 48 hours. If you are still experiencing difficulty, call your lactation consultant or caregiver.  CARING FOR YOURSELF Take care of your   breasts  Bathe or shower daily.   Avoid using soap on your nipples.   Start feedings on your left breast at one feeding and on your right breast at the next feeding.   You will notice an increase in your milk supply 2 5 days after delivery. You may feel some discomfort from engorgement, which makes your breasts very firm and often tender. Engorgement "peaks" out within 24 48 hours. In the meantime, apply warm moist towels to your breasts for 5 10 minutes before feeding. Gentle massage and expression of some milk before feeding will soften your breasts, making it easier for your baby to latch on.    Wear a well-fitting nursing bra, and air dry your nipples for a 3 4minutes after each feeding.   Only use cotton bra pads.   Only use pure lanolin on your nipples after nursing. You do not need to wash it off before feeding the baby again. Another option is to express a few drops of breast milk and gently massage it into your nipples.  Take care of yourself  Eat well-balanced meals and nutritious snacks.   Drinking milk, fruit juice, and water to satisfy your thirst (about 8 glasses a day).   Get plenty of rest.  Avoid foods that you notice affect the baby in a bad way.  SEEK MEDICAL CARE IF:   You have difficulty with breastfeeding and need help.   You have a hard, red, sore area on your breast that is accompanied by a fever.   Your baby is too sleepy to eat well or is having trouble sleeping.   Your baby is wetting less than 6 diapers a day, by 5 days of age.   Your baby's skin or white part of his or her eyes is more yellow than it was in the hospital.   You feel depressed.  Document Released: 02/04/2005 Document Revised: 08/06/2011 Document Reviewed: 05/05/2011 [employer] Patient Information 2013 [employer].  

## 2012-05-21 NOTE — Progress Notes (Signed)
Nitrites and prot, hgb in urine Check urine culture, otherwise doing well

## 2012-05-25 ENCOUNTER — Telehealth: Payer: Self-pay | Admitting: General Practice

## 2012-05-25 MED ORDER — CEPHALEXIN 500 MG PO CAPS: 500.0000 mg | ORAL_CAPSULE | Freq: Four times a day (QID) | ORAL | Status: DC

## 2012-05-25 NOTE — Telephone Encounter (Signed)
Message copied by Kathee Delton on Mon May 25, 2012  4:21 PM ------      Message from: Reva Bores      Created: Mon May 25, 2012  8:04 AM       Needs Keflex 500 mg qid x 7 d # 28 no RF ------

## 2012-05-25 NOTE — Telephone Encounter (Signed)
Called patient and informed her of UTI and that an antibiotic has been called in to [facility] on [address] and she will have to take that 4 times a day for a week. Patient verbalized understanding and asked when next Dr appt was- I informed her of her 4/17 appt @ 930. Patient verbalized understanding and had no further questions

## 2012-05-26 ENCOUNTER — Encounter: Payer: Self-pay | Admitting: *Deleted

## 2012-06-01 LAB — OB RESULTS CONSOLE GBS: GBS: NEGATIVE

## 2012-06-03 ENCOUNTER — Encounter (HOSPITAL_COMMUNITY): Payer: Self-pay | Admitting: *Deleted

## 2012-06-03 ENCOUNTER — Inpatient Hospital Stay (HOSPITAL_COMMUNITY)
Admission: AD | Admit: 2012-06-03 | Discharge: 2012-06-03 | Disposition: A | Discharge status: Home / Self Care | Payer: Self-pay | Source: Ambulatory Visit | Attending: Obstetrics & Gynecology | Admitting: Obstetrics & Gynecology

## 2012-06-03 DIAGNOSIS — D649 Anemia, unspecified: Secondary | ICD-10-CM

## 2012-06-03 DIAGNOSIS — O98319 Other infections with a predominantly sexual mode of transmission complicating pregnancy, unspecified trimester: Secondary | ICD-10-CM | POA: Insufficient documentation

## 2012-06-03 DIAGNOSIS — O4703 False labor before 37 completed weeks of gestation, third trimester: Secondary | ICD-10-CM

## 2012-06-03 DIAGNOSIS — A749 Chlamydial infection, unspecified: Secondary | ICD-10-CM

## 2012-06-03 DIAGNOSIS — O4700 False labor before 37 completed weeks of gestation, unspecified trimester: Secondary | ICD-10-CM | POA: Insufficient documentation

## 2012-06-03 DIAGNOSIS — A5619 Other chlamydial genitourinary infection: Secondary | ICD-10-CM | POA: Insufficient documentation

## 2012-06-03 DIAGNOSIS — N739 Female pelvic inflammatory disease, unspecified: Secondary | ICD-10-CM | POA: Insufficient documentation

## 2012-06-03 LAB — URINALYSIS, ROUTINE W REFLEX MICROSCOPIC
Bilirubin Urine: NEGATIVE
Glucose, UA: NEGATIVE mg/dL
Hgb urine dipstick: NEGATIVE
Ketones, ur: NEGATIVE mg/dL
Specific Gravity, Urine: 1.02 (ref 1.005–1.030)
pH: 6.5 (ref 5.0–8.0)

## 2012-06-03 LAB — URINE MICROSCOPIC-ADD ON

## 2012-06-03 NOTE — MAU Provider Note (Signed)
  History     CSN: [id number]  Arrival date and time: 06/03/12 1710   None     Chief Complaint  Patient presents with  . Labor Eval   HPI This is a 22 y.o. female at [redacted]w[redacted]d who presents with c/o contractions since last night. Denies leaking or bleeding and reports + FM.     OB History   Grav Para Term Preterm Abortions TAB SAB Ect Mult Living   2 1 1       1       History reviewed. No pertinent past medical history.  History reviewed. No pertinent past surgical history.  Family History  Problem Relation Age of Onset  . Hypertension Mother   . Diabetes Father   . Diabetes Paternal Grandmother     History  Substance Use Topics  . Smoking status: Never Smoker   . Smokeless tobacco: Never Used  . Alcohol Use: No    Allergies: No Known Allergies  Prescriptions prior to admission  Medication Sig Dispense Refill  . acetaminophen (TYLENOL) 500 MG tablet Take 1,000 mg by mouth every 6 (six) hours as needed. For pain      . Prenatal Vit-Fe Fumarate-FA (PRENATAL MULTIVITAMIN) TABS Take 1 tablet by mouth daily at 12 noon.        Review of Systems  Constitutional: Negative for fever, chills and malaise/fatigue.  Gastrointestinal: Positive for abdominal pain. Negative for nausea, vomiting, diarrhea and constipation.  Neurological: Negative for headaches.   Physical Exam   Blood pressure 133/87, pulse 93, temperature 98 F (36.7 C), temperature source Oral, resp. rate 20, height 5\' 7"  (1.702 m), weight 253 lb 6.4 oz (114.941 kg), last menstrual period 09/22/2011, SpO2 100.00%.  Physical Exam  Constitutional: She is oriented to person, place, and time. She appears well-developed and well-nourished. No distress.  Cardiovascular: Normal rate.   Respiratory: Effort normal.  GI: Soft. There is no tenderness.  Genitourinary: Vagina normal and uterus normal. No vaginal discharge found.  Dilation: 1.5 Effacement (%): 80 Cervical Position: Posterior Station: -2 Exam by::  (L.  Paschal, RN)   Musculoskeletal: Normal range of motion.  Neurological: She is alert and oriented to person, place, and time.  Skin: Skin is warm and dry.  Psychiatric: She has a normal mood and affect.   Dilation: 1.5 Effacement (%): 80 Cervical Position: Posterior Station: -2 Exam by::  (L. Paschal, RN)   Unchanged after one hour  MAU Course  Procedures  MDM No change after an hour.  Assessment and Plan  A:  [facility] at [redacted]w[redacted]d       Contractions, prodromal P:  Discharge home       Follow up in clinic  [facility] 06/03/2012, 7:19 PM

## 2012-06-03 NOTE — MAU Note (Signed)
Pt came in because she has been having contraction since the weekend, Pt states she started having contractions again this morning

## 2012-06-03 NOTE — Progress Notes (Signed)
Pt states every time she eats she throws up

## 2012-06-03 NOTE — MAU Note (Signed)
Patient states she is having contractions every 5-6 minutes.Denies bleeding or leaking and reports good fetal movement. Patient states she has urinary frequency but then is unable to urinate.

## 2012-06-03 NOTE — Progress Notes (Signed)
Pt states she see spots when she throws up

## 2012-06-04 ENCOUNTER — Ambulatory Visit (INDEPENDENT_AMBULATORY_CARE_PROVIDER_SITE_OTHER): Payer: Self-pay | Admitting: Obstetrics and Gynecology

## 2012-06-04 ENCOUNTER — Encounter: Payer: Self-pay | Admitting: Obstetrics and Gynecology

## 2012-06-04 VITALS — BP 136/93 | Temp 97.1°F | Wt 249.8 lb

## 2012-06-04 DIAGNOSIS — O09299 Supervision of pregnancy with other poor reproductive or obstetric history, unspecified trimester: Secondary | ICD-10-CM

## 2012-06-04 DIAGNOSIS — Z3483 Encounter for supervision of other normal pregnancy, third trimester: Secondary | ICD-10-CM

## 2012-06-04 DIAGNOSIS — A749 Chlamydial infection, unspecified: Secondary | ICD-10-CM

## 2012-06-04 DIAGNOSIS — O09293 Supervision of pregnancy with other poor reproductive or obstetric history, third trimester: Secondary | ICD-10-CM

## 2012-06-04 DIAGNOSIS — O0930 Supervision of pregnancy with insufficient antenatal care, unspecified trimester: Secondary | ICD-10-CM

## 2012-06-04 DIAGNOSIS — O98319 Other infections with a predominantly sexual mode of transmission complicating pregnancy, unspecified trimester: Secondary | ICD-10-CM

## 2012-06-04 DIAGNOSIS — O0933 Supervision of pregnancy with insufficient antenatal care, third trimester: Secondary | ICD-10-CM

## 2012-06-04 LAB — URINE CULTURE

## 2012-06-04 LAB — POCT URINALYSIS DIP (DEVICE)
Ketones, ur: 40 mg/dL — AB
Protein, ur: NEGATIVE mg/dL
Specific Gravity, Urine: 1.015 (ref 1.005–1.030)
Urobilinogen, UA: 1 mg/dL (ref 0.0–1.0)

## 2012-06-04 NOTE — Progress Notes (Signed)
Patient doing well without complaints. Occasional contractions. Cultures done today. FM/PTL precautions reviewed. Patient with bordeline BP will check labs today

## 2012-06-04 NOTE — Progress Notes (Signed)
Pain in lower back and lower abdomen. Went to MAU last night with contractions 5-6 mins. Apart for most of the day.  No longer having contractions.

## 2012-06-06 LAB — CULTURE, OB URINE: Colony Count: 65000

## 2012-06-07 LAB — CULTURE, BETA STREP (GROUP B ONLY)

## 2012-06-07 LAB — GC/CHLAMYDIA PROBE AMP: CT Probe RNA: POSITIVE — AB

## 2012-06-11 ENCOUNTER — Encounter (HOSPITAL_COMMUNITY): Payer: Self-pay | Admitting: *Deleted

## 2012-06-11 ENCOUNTER — Ambulatory Visit (INDEPENDENT_AMBULATORY_CARE_PROVIDER_SITE_OTHER): Payer: Self-pay | Admitting: Family Medicine

## 2012-06-11 ENCOUNTER — Inpatient Hospital Stay (HOSPITAL_COMMUNITY)
Admission: AD | Admit: 2012-06-11 | Discharge: 2012-06-14 | DRG: 774 | Disposition: A | Discharge status: Home / Self Care | Payer: MEDICAID | Source: Ambulatory Visit | Attending: Obstetrics & Gynecology | Admitting: Obstetrics & Gynecology

## 2012-06-11 VITALS — BP 152/95 | Temp 97.5°F | Wt 251.5 lb

## 2012-06-11 DIAGNOSIS — O0930 Supervision of pregnancy with insufficient antenatal care, unspecified trimester: Secondary | ICD-10-CM

## 2012-06-11 DIAGNOSIS — A749 Chlamydial infection, unspecified: Secondary | ICD-10-CM

## 2012-06-11 DIAGNOSIS — A5619 Other chlamydial genitourinary infection: Secondary | ICD-10-CM | POA: Diagnosis present

## 2012-06-11 DIAGNOSIS — O98319 Other infections with a predominantly sexual mode of transmission complicating pregnancy, unspecified trimester: Secondary | ICD-10-CM | POA: Diagnosis present

## 2012-06-11 DIAGNOSIS — O0933 Supervision of pregnancy with insufficient antenatal care, third trimester: Secondary | ICD-10-CM

## 2012-06-11 DIAGNOSIS — N739 Female pelvic inflammatory disease, unspecified: Secondary | ICD-10-CM | POA: Diagnosis present

## 2012-06-11 DIAGNOSIS — O139 Gestational [pregnancy-induced] hypertension without significant proteinuria, unspecified trimester: Principal | ICD-10-CM | POA: Diagnosis present

## 2012-06-11 DIAGNOSIS — D649 Anemia, unspecified: Secondary | ICD-10-CM

## 2012-06-11 LAB — POCT URINALYSIS DIP (DEVICE)
Bilirubin Urine: NEGATIVE
Glucose, UA: NEGATIVE mg/dL
Nitrite: NEGATIVE
Urobilinogen, UA: 4 mg/dL — ABNORMAL HIGH (ref 0.0–1.0)
pH: 7 (ref 5.0–8.0)

## 2012-06-11 LAB — CBC
MCH: 29.5 pg (ref 26.0–34.0)
MCV: 90.9 fL (ref 78.0–100.0)
Platelets: 290 10*3/uL (ref 150–400)
RDW: 13.7 % (ref 11.5–15.5)

## 2012-06-11 LAB — PROTEIN / CREATININE RATIO, URINE
Creatinine, Urine: 145.83 mg/dL
Protein Creatinine Ratio: 0.1 (ref 0.00–0.15)
Total Protein, Urine: 14.1 mg/dL

## 2012-06-11 LAB — RPR: RPR Ser Ql: NONREACTIVE

## 2012-06-11 LAB — TYPE AND SCREEN
ABO/RH(D): A POS
Antibody Screen: NEGATIVE

## 2012-06-11 LAB — COMPREHENSIVE METABOLIC PANEL
AST: 18 U/L (ref 0–37)
Albumin: 2.5 g/dL — ABNORMAL LOW (ref 3.5–5.2)
BUN: 3 mg/dL — ABNORMAL LOW (ref 6–23)
Calcium: 9.2 mg/dL (ref 8.4–10.5)
Creatinine, Ser: 0.52 mg/dL (ref 0.50–1.10)
Total Protein: 7.3 g/dL (ref 6.0–8.3)

## 2012-06-11 MED ORDER — LACTATED RINGERS IV SOLN
500.0000 mL | Freq: Once | INTRAVENOUS | Status: AC
  Administered 2012-06-12: 500 mL via INTRAVENOUS

## 2012-06-11 MED ORDER — FENTANYL 2.5 MCG/ML BUPIVACAINE 1/10 % EPIDURAL INFUSION (WH - ANES)
14.0000 mL/h | INTRAMUSCULAR | Status: DC | PRN
  Administered 2012-06-12: 14 mL/h via EPIDURAL
  Filled 2012-06-11: qty 125

## 2012-06-11 MED ORDER — LABETALOL HCL 5 MG/ML IV SOLN
10.0000 mg | INTRAVENOUS | Status: DC | PRN
  Filled 2012-06-11: qty 4

## 2012-06-11 MED ORDER — PHENYLEPHRINE 40 MCG/ML (10ML) SYRINGE FOR IV PUSH (FOR BLOOD PRESSURE SUPPORT)
80.0000 ug | PREFILLED_SYRINGE | INTRAVENOUS | Status: DC | PRN
  Filled 2012-06-11: qty 2

## 2012-06-11 MED ORDER — EPHEDRINE 5 MG/ML INJ
10.0000 mg | INTRAVENOUS | Status: DC | PRN
  Filled 2012-06-11: qty 4
  Filled 2012-06-11: qty 2

## 2012-06-11 MED ORDER — DEXTROSE 5 % IV SOLN
250.0000 mg | Freq: Once | INTRAVENOUS | Status: AC
  Administered 2012-06-11: 250 mg via INTRAVENOUS
  Filled 2012-06-11: qty 250

## 2012-06-11 MED ORDER — MISOPROSTOL 25 MCG QUARTER TABLET
25.0000 ug | ORAL_TABLET | ORAL | Status: DC | PRN
  Administered 2012-06-11: 25 ug via VAGINAL
  Filled 2012-06-11: qty 1
  Filled 2012-06-11: qty 0.25

## 2012-06-11 MED ORDER — OXYTOCIN 40 UNITS IN LACTATED RINGERS INFUSION - SIMPLE MED: 62.5000 mL/h | INTRAVENOUS | Status: DC

## 2012-06-11 MED ORDER — LACTATED RINGERS IV SOLN: 500.0000 mL | INTRAVENOUS | Status: DC | PRN

## 2012-06-11 MED ORDER — NALBUPHINE SYRINGE 5 MG/0.5 ML
5.0000 mg | INJECTION | INTRAMUSCULAR | Status: DC | PRN
Start: 2012-06-11 — End: 2012-06-12
  Administered 2012-06-11 – 2012-06-12 (×3): 5 mg via INTRAVENOUS
  Filled 2012-06-11 (×3): qty 0.5
  Filled 2012-06-11: qty 1

## 2012-06-11 MED ORDER — LACTATED RINGERS IV SOLN
INTRAVENOUS | Status: DC
  Administered 2012-06-11 (×2): via INTRAVENOUS
  Administered 2012-06-12: 125 mL/h via INTRAVENOUS
  Administered 2012-06-12: 02:00:00 via INTRAVENOUS

## 2012-06-11 MED ORDER — OXYTOCIN BOLUS FROM INFUSION: 500.0000 mL | INTRAVENOUS | Status: DC

## 2012-06-11 MED ORDER — LIDOCAINE HCL (PF) 1 % IJ SOLN
30.0000 mL | INTRAMUSCULAR | Status: DC | PRN
  Filled 2012-06-11 (×2): qty 30

## 2012-06-11 MED ORDER — IBUPROFEN 600 MG PO TABS: 600.0000 mg | ORAL_TABLET | Freq: Four times a day (QID) | ORAL | Status: DC | PRN

## 2012-06-11 MED ORDER — EPHEDRINE 5 MG/ML INJ
10.0000 mg | INTRAVENOUS | Status: DC | PRN
  Filled 2012-06-11: qty 2

## 2012-06-11 MED ORDER — OXYTOCIN 40 UNITS IN LACTATED RINGERS INFUSION - SIMPLE MED
1.0000 m[IU]/min | INTRAVENOUS | Status: DC
  Administered 2012-06-12: 2 m[IU]/min via INTRAVENOUS
  Filled 2012-06-11: qty 1000

## 2012-06-11 MED ORDER — ONDANSETRON HCL 4 MG/2ML IJ SOLN: 4.0000 mg | Freq: Four times a day (QID) | INTRAMUSCULAR | Status: DC | PRN

## 2012-06-11 MED ORDER — OXYCODONE-ACETAMINOPHEN 5-325 MG PO TABS: 1.0000 | ORAL_TABLET | ORAL | Status: DC | PRN

## 2012-06-11 MED ORDER — AZITHROMYCIN 500 MG PO TABS
1000.0000 mg | ORAL_TABLET | Freq: Every day | ORAL | Status: DC
  Administered 2012-06-11: 1000 mg via ORAL
  Filled 2012-06-11 (×2): qty 2

## 2012-06-11 MED ORDER — CITRIC ACID-SODIUM CITRATE 334-500 MG/5ML PO SOLN: 30.0000 mL | ORAL | Status: DC | PRN

## 2012-06-11 MED ORDER — PHENYLEPHRINE 40 MCG/ML (10ML) SYRINGE FOR IV PUSH (FOR BLOOD PRESSURE SUPPORT)
80.0000 ug | PREFILLED_SYRINGE | INTRAVENOUS | Status: DC | PRN
  Filled 2012-06-11: qty 5
  Filled 2012-06-11: qty 2

## 2012-06-11 MED ORDER — FLEET ENEMA 7-19 GM/118ML RE ENEM: 1.0000 | ENEMA | RECTAL | Status: DC | PRN

## 2012-06-11 MED ORDER — DEXTROSE 5 % IV SOLN: 2.5000 10*6.[IU] | INTRAVENOUS | Status: DC

## 2012-06-11 MED ORDER — TERBUTALINE SULFATE 1 MG/ML IJ SOLN: 0.2500 mg | Freq: Once | INTRAMUSCULAR | Status: AC | PRN

## 2012-06-11 MED ORDER — DIPHENHYDRAMINE HCL 50 MG/ML IJ SOLN
12.5000 mg | INTRAMUSCULAR | Status: DC | PRN
  Administered 2012-06-12: 12.5 mg via INTRAVENOUS
  Filled 2012-06-11: qty 1

## 2012-06-11 MED ORDER — ACETAMINOPHEN 325 MG PO TABS: 650.0000 mg | ORAL_TABLET | ORAL | Status: DC | PRN

## 2012-06-11 MED ORDER — PENICILLIN G POTASSIUM 5000000 UNITS IJ SOLR: 5.0000 10*6.[IU] | Freq: Once | INTRAVENOUS | Status: DC

## 2012-06-11 NOTE — Progress Notes (Signed)
BP up second time today--will induce for GHTN GBS negative Needs treatment for chlamydia

## 2012-06-11 NOTE — H&P (Signed)
I examined pt and agree with documentation above and resident plan of care. MUHAMMAD,Mirelle Biskup  

## 2012-06-11 NOTE — Progress Notes (Signed)
   Whitney Todd is a 22 y.o. G2P1001 at [redacted]w[redacted]d  admitted for IOL for GHTN  Subjective:  Feeling mild contractions Objective: BP 139/87  Pulse 102  Temp(Src) 97.7 F (36.5 C) (Oral)  Resp 20  Ht 5\' 7"  (1.702 m)  Wt 113.853 kg (251 lb)  BMI 39.3 kg/m2  LMP 09/22/2011    FHT:  FHR: 140 bpm, variability: moderate,  accelerations:  Present,  decelerations:  Absent UC:   irregular, every 2-5 minutes SVE:   Dilation: 2 Effacement (%): 50 Station: -3 Exam by:: rigby Foley inserted into cervix and inflated with 60cc H20 Labs: Lab Results  Component Value Date   WBC 11.5* 06/11/2012   HGB 9.4* 06/11/2012   HCT 29.0* 06/11/2012   MCV 90.9 06/11/2012   PLT 290 06/11/2012    Assessment / Plan: IOL for GHTN, ripening phase Start Pitocin when Foley falls out Labor: no Fetal Wellbeing:  Category I Pain Control:  Labor support without medications Anticipated MOD:  NSVD  CRESENZO-DISHMAN,Andres Escandon 06/11/2012, 7:16 PM

## 2012-06-11 NOTE — Progress Notes (Addendum)
   Whitney Todd is a 22 y.o. G2P1001 at 101w4d  admitted for [facility]  Subjective:  Feels "crampy" Objective: BP 149/81  Pulse 82  Temp(Src) 98.4 F (36.9 C)  Resp 18  Ht 5\' 7"  (1.702 m)  Wt 113.853 kg (251 lb)  BMI 39.3 kg/m2  LMP 09/22/2011    FHT:  FHR: 140 bpm, variability: moderate,  accelerations:  Present,  decelerations:  Absent UC:   irregular, every 2-4 minutes  SVE:   Deferred   Labs: Lab Results  Component Value Date   WBC 11.5* 06/11/2012   HGB 9.4* 06/11/2012   HCT 29.0* 06/11/2012   MCV 90.9 06/11/2012   PLT 290 06/11/2012    Assessment / Plan: IOL for GHTN, ripening phase Will allow to eat, shower.  Recheck afterwards  Labor: no Fetal Wellbeing:  Category I Pain Control:  Labor support without medications Anticipated MOD:  NSVD  Todd,Whitney Vittorio 06/11/2012, 5:31 PM

## 2012-06-11 NOTE — H&P (Signed)
Whitney Todd is a 22 y.o. female presenting for IOL due to worsening gestational HTN. Maternal Medical History:  Reason for admission: Nausea. Pt is a 22 y.o. G2P1001 [redacted]w[redacted]d by LMP  who presents today for induction of labor due to gestational hypertension.  Her OB history is significant for prior gestational hypertension, late prenatal care.  She reports a per blood pressure has been continuing to climb over her last last office visits.  Her PIH labs have been within normal limits however due to her increasingly elevated blood pressures she is a candidate for induction at 37 weeks.  Today at routine prenatal care appointment her blood pressure was significantly elevated to the high 140s over 90s.  She is felt to be appropriate for induction at that te.  Of note she has had multiple chlamydial infections during this pregnancy and has been treated subsequent times.  She denies any subsequent reexposure as she reports abstinence.    [facility] Genetic Screen  Too late  Anatomic [place]  29 wks, nml  Glucose Screen  107  GBS  Neg  Feeding Preference  Breast  Contraception  Nexplanon or Mirena  Circumcision  Female    OB History   Grav Para Term Preterm Abortions TAB SAB Ect Mult Living   2 1 1       1      History reviewed. No pertinent past medical history. History reviewed. No pertinent past surgical history. Family History: family history includes Diabetes in her father and paternal grandmother and Hypertension in her mother. Social History:  reports that she has never smoked. She has never used smokeless tobacco. She reports that she does not drink alcohol or use illicit drugs.   Prenatal Transfer Tool  Maternal Diabetes: No Genetic Screening: too late Maternal Ultrasounds/Referrals: Normal Fetal Ultrasounds or other Referrals:  None Maternal Substance Abuse:  No Significant Maternal Medications:  None Significant Maternal Lab Results:  None Other Comments:  None  Review  of Systems  Constitutional: Negative.  Negative for fever and chills.  HENT: Positive for congestion (felt to be due to allergies).   Eyes: Negative.  Negative for blurred vision and double vision.  Respiratory: Negative.  Negative for cough and wheezing.   Cardiovascular: Negative.  Negative for leg swelling.  Gastrointestinal: Negative.  Negative for heartburn, nausea and vomiting.  Genitourinary: Negative.  Negative for dysuria, urgency and frequency.  Musculoskeletal: Negative.  Negative for myalgias, back pain and falls.  Skin: Negative.   Neurological: Negative.  Negative for headaches.  Endo/Heme/Allergies: Negative.   Psychiatric/Behavioral: Negative.     Dilation: Fingertip Effacement (%): Thick Station: -2 Exam by:: rigby Blood pressure 143/86, pulse 86, temperature 98.4 F (36.9 C), resp. rate 16, height 5\' 7"  (1.702 m), weight 113.853 kg (251 lb), last menstrual period 09/22/2011. Maternal Exam:  Uterine Assessment: Contraction strength is moderate.  Contraction frequency is irregular.   Abdomen: Patient reports no abdominal tenderness. Fundal height is appropriate for gestation.   Fetal presentation: vertex  Introitus: Normal vulva. Normal vagina.  Pelvis: adequate for delivery.   Cervix: Cervix evaluated by digital exam.     Fetal Exam Fetal Monitor Review: Mode: ultrasound.   Baseline rate: 135.  Variability: moderate (6-25 bpm).   Pattern: accelerations present.    Fetal State Assessment: Category I - tracings are normal.     Physical Exam  Nursing note and vitals reviewed. Constitutional: She is oriented to person, place, and time. She appears well-developed and well-nourished. No distress.  HENT:  Head: Normocephalic and atraumatic.  Eyes: Conjunctivae are normal. Pupils are equal, round, and reactive to light.  Neck: Normal range of motion. No JVD present. No tracheal deviation present.  Cardiovascular: Normal rate and regular rhythm.  Exam reveals  no gallop and no friction rub.   Murmur (II/VI systolic flow murmur) heard. Respiratory: Effort normal and breath sounds normal. No respiratory distress. She has no wheezes.  GI: Soft. She exhibits distension (Gravid).  Musculoskeletal: She exhibits no edema and no tenderness.  Neurological: She is alert and oriented to person, place, and time. She has normal reflexes. She displays normal reflexes. She exhibits normal muscle tone.  Skin: Skin is warm and dry. No rash noted. She is not diaphoretic. No erythema. No pallor.  Psychiatric: She has a normal mood and affect. Her behavior is normal. Judgment and thought content normal.     Dilation: Fingertip Effacement (%): Thick Cervical Position: Posterior Station: -2 Presentation: Vertex Exam by:: rigby   Prenatal labs: ABO, Rh: A/POS/-- (03/10 7829) Antibody: NEG (03/10 0953) Rubella: 1.66 (03/10 0953) RPR: NON REAC (03/10 0953)  HBsAg: NEGATIVE (03/10 0953)  HIV: NON REACTIVE (03/10 0953)  GBS: Negative (04/14 0000)   Assessment/Plan: Admit to L&D for IOL due to [facility].  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  Will Tx with Rocephin & Azithro Expectant management, anticipated MOD = Vaginal   Whitney Mews, DO [facility] Resident - PGY-2 06/11/2012 2:50 PM

## 2012-06-11 NOTE — Progress Notes (Signed)
Pulse-85    Pain/pressure-vaginal/back BP Recheck 153/103

## 2012-06-12 ENCOUNTER — Encounter (HOSPITAL_COMMUNITY): Payer: Self-pay | Admitting: *Deleted

## 2012-06-12 ENCOUNTER — Inpatient Hospital Stay (HOSPITAL_COMMUNITY): Payer: Self-pay | Admitting: Anesthesiology

## 2012-06-12 ENCOUNTER — Encounter (HOSPITAL_COMMUNITY): Payer: Self-pay | Admitting: Anesthesiology

## 2012-06-12 DIAGNOSIS — O139 Gestational [pregnancy-induced] hypertension without significant proteinuria, unspecified trimester: Secondary | ICD-10-CM

## 2012-06-12 DIAGNOSIS — A5619 Other chlamydial genitourinary infection: Secondary | ICD-10-CM

## 2012-06-12 DIAGNOSIS — O98319 Other infections with a predominantly sexual mode of transmission complicating pregnancy, unspecified trimester: Secondary | ICD-10-CM

## 2012-06-12 DIAGNOSIS — N739 Female pelvic inflammatory disease, unspecified: Secondary | ICD-10-CM

## 2012-06-12 LAB — CBC
HCT: 27.4 % — ABNORMAL LOW (ref 36.0–46.0)
HCT: 28 % — ABNORMAL LOW (ref 36.0–46.0)
Hemoglobin: 9 g/dL — ABNORMAL LOW (ref 12.0–15.0)
MCH: 29.5 pg (ref 26.0–34.0)
MCV: 90.7 fL (ref 78.0–100.0)
MCV: 90.9 fL (ref 78.0–100.0)
Platelets: 289 10*3/uL (ref 150–400)
RBC: 3.02 MIL/uL — ABNORMAL LOW (ref 3.87–5.11)
RBC: 3.08 MIL/uL — ABNORMAL LOW (ref 3.87–5.11)
RDW: 13.9 % (ref 11.5–15.5)
WBC: 11.5 10*3/uL — ABNORMAL HIGH (ref 4.0–10.5)

## 2012-06-12 MED ORDER — OXYTOCIN 40 UNITS IN LACTATED RINGERS INFUSION - SIMPLE MED: 62.5000 mL/h | INTRAVENOUS | Status: DC | PRN

## 2012-06-12 MED ORDER — SENNOSIDES-DOCUSATE SODIUM 8.6-50 MG PO TABS
2.0000 | ORAL_TABLET | Freq: Every day | ORAL | Status: DC
  Administered 2012-06-13: 2 via ORAL

## 2012-06-12 MED ORDER — TETANUS-DIPHTH-ACELL PERTUSSIS 5-2.5-18.5 LF-MCG/0.5 IM SUSP: 0.5000 mL | Freq: Once | INTRAMUSCULAR | Status: DC

## 2012-06-12 MED ORDER — BENZOCAINE-MENTHOL 20-0.5 % EX AERO
1.0000 "application " | INHALATION_SPRAY | CUTANEOUS | Status: DC | PRN
  Administered 2012-06-12: 1 via TOPICAL
  Filled 2012-06-12: qty 56

## 2012-06-12 MED ORDER — IBUPROFEN 600 MG PO TABS
600.0000 mg | ORAL_TABLET | Freq: Four times a day (QID) | ORAL | Status: DC
  Administered 2012-06-12 – 2012-06-14 (×7): 600 mg via ORAL
  Filled 2012-06-12 (×6): qty 1

## 2012-06-12 MED ORDER — SODIUM BICARBONATE 8.4 % IV SOLN
INTRAVENOUS | Status: DC | PRN
  Administered 2012-06-12: 5 mL via EPIDURAL

## 2012-06-12 MED ORDER — SIMETHICONE 80 MG PO CHEW: 80.0000 mg | CHEWABLE_TABLET | ORAL | Status: DC | PRN

## 2012-06-12 MED ORDER — FERROUS SULFATE 325 (65 FE) MG PO TABS
325.0000 mg | ORAL_TABLET | Freq: Two times a day (BID) | ORAL | Status: DC
  Administered 2012-06-13 – 2012-06-14 (×3): 325 mg via ORAL
  Filled 2012-06-12 (×3): qty 1

## 2012-06-12 MED ORDER — FLEET ENEMA 7-19 GM/118ML RE ENEM: 1.0000 | ENEMA | Freq: Every day | RECTAL | Status: DC | PRN

## 2012-06-12 MED ORDER — CEPHALEXIN 500 MG PO CAPS: 500.0000 mg | ORAL_CAPSULE | Freq: Four times a day (QID) | ORAL | Status: DC

## 2012-06-12 MED ORDER — ONDANSETRON HCL 4 MG PO TABS: 4.0000 mg | ORAL_TABLET | ORAL | Status: DC | PRN

## 2012-06-12 MED ORDER — PRENATAL MULTIVITAMIN CH
1.0000 | ORAL_TABLET | Freq: Every day | ORAL | Status: DC
  Administered 2012-06-13: 1 via ORAL
  Filled 2012-06-12: qty 1

## 2012-06-12 MED ORDER — MEASLES, MUMPS & RUBELLA VAC ~~LOC~~ INJ: 0.5000 mL | INJECTION | Freq: Once | SUBCUTANEOUS | Status: DC

## 2012-06-12 MED ORDER — WITCH HAZEL-GLYCERIN EX PADS: 1.0000 "application " | MEDICATED_PAD | CUTANEOUS | Status: DC | PRN

## 2012-06-12 MED ORDER — OXYCODONE-ACETAMINOPHEN 5-325 MG PO TABS
1.0000 | ORAL_TABLET | ORAL | Status: DC | PRN
  Administered 2012-06-12 – 2012-06-14 (×3): 1 via ORAL
  Filled 2012-06-12 (×3): qty 1

## 2012-06-12 MED ORDER — LANOLIN HYDROUS EX OINT: TOPICAL_OINTMENT | CUTANEOUS | Status: DC | PRN

## 2012-06-12 MED ORDER — ZOLPIDEM TARTRATE 5 MG PO TABS: 5.0000 mg | ORAL_TABLET | Freq: Every evening | ORAL | Status: DC | PRN

## 2012-06-12 MED ORDER — DIBUCAINE 1 % RE OINT: 1.0000 "application " | TOPICAL_OINTMENT | RECTAL | Status: DC | PRN

## 2012-06-12 MED ORDER — DIPHENHYDRAMINE HCL 25 MG PO CAPS: 25.0000 mg | ORAL_CAPSULE | Freq: Four times a day (QID) | ORAL | Status: DC | PRN

## 2012-06-12 MED ORDER — ONDANSETRON HCL 4 MG/2ML IJ SOLN: 4.0000 mg | INTRAMUSCULAR | Status: DC | PRN

## 2012-06-12 MED ORDER — BISACODYL 10 MG RE SUPP: 10.0000 mg | Freq: Every day | RECTAL | Status: DC | PRN

## 2012-06-12 NOTE — Progress Notes (Signed)
   Whitney Todd is a 22 y.o. G2P1001 at [redacted]w[redacted]d  admitted for induction of labor due to Hypertension.  Subjective: sleeping  Objective: BP 142/93  Pulse 93  Temp(Src) 98 F (36.7 C) (Oral)  Resp 18  Ht 5\' 7"  (1.702 m)  Wt 113.853 kg (251 lb)  BMI 39.3 kg/m2  LMP 09/22/2011    FHT:  FHR: 130 bpm, variability: moderate,  accelerations:  Present,  decelerations:  Absent UC:   irregular, every 3-7 minutes SVE:   Foley still in  Labs: Lab Results  Component Value Date   WBC 11.5* 06/11/2012   HGB 9.4* 06/11/2012   HCT 29.0* 06/11/2012   MCV 90.9 06/11/2012   PLT 290 06/11/2012    Assessment / Plan: IOL for GHTn, ripeing phase Start Pitcocin when foley falls out Labor: no Fetal Wellbeing:  Category I Pain Control:  Labor support without medications Anticipated MOD:  NSVD  CRESENZO-DISHMAN,Whitney Todd 06/12/2012, 1:16 AM

## 2012-06-12 NOTE — Progress Notes (Signed)
Whitney Todd is a 22 y.o. G2P1001 at [redacted]w[redacted]d  admitted for induction of labor due to Hypertension.  Subjective: Comfortable.  Foley still in.  Wants epidural prior to Pitocin  Objective: BP 143/73  Pulse 91  Temp(Src) 98.4 F (36.9 C) (Oral)  Resp 18  Ht 5\' 7"  (1.702 m)  Wt 113.853 kg (251 lb)  BMI 39.3 kg/m2  LMP 09/22/2011    FHT:  FHR: 125 bpm, variability: moderate,  accelerations:  Present,  decelerations:  Absent UC:   irregular, every 3-7 minutes SVE:   Foley still in  Labs: Lab Results  Component Value Date   WBC 11.5* 06/11/2012   HGB 9.4* 06/11/2012   HCT 29.0* 06/11/2012   MCV 90.9 06/11/2012   PLT 290 06/11/2012    Assessment / Plan: IOL for GHTn, ripeing phase Start Pitcocin 3  Labor: no Fetal Wellbeing:  Category I Pain Control:  Labor support without medications Anticipated MOD:  NSVD   Whitney Mews, DO [facility] Resident - PGY-2 06/12/2012 7:18 AM

## 2012-06-12 NOTE — Progress Notes (Signed)
Patient ID: Whitney Todd, female   DOB: 1990-09-29, 22 y.o.   MRN: [id number]   S:  Pt has epidural - comfortable.  O:  Filed Vitals:   06/12/12 1001 06/12/12 1031 06/12/12 1101 06/12/12 1119  BP: 141/81 140/88 141/84   Pulse: 92 91 90   Temp: 98.1 F (36.7 C)     TempSrc: Oral     Resp: 18 20 18 20   Height:      Weight:      SpO2:        Cervix:  5/70/-3, bulging bag, posterior  FHTs:  140-150, var min to mod, accels present, occasional early decel TOCO:  q 2-3 min  A/P 22 y.o. G2P1001 at [redacted]w[redacted]d here for IOL for GHTN.  - BP in mild range - PIH labs negative - Progressing on pitocin (s/p cytotec and FB) - Anticipate SVD  Napoleon Form, MD

## 2012-06-12 NOTE — Anesthesia Preprocedure Evaluation (Addendum)
Anesthesia Evaluation  Patient identified by MRN, date of birth, ID band Patient awake    Reviewed: Allergy & Precautions, H&P , Patient's Chart, lab work & pertinent test results  Airway Mallampati: III TM Distance: >3 FB Neck ROM: full    Dental  (+) Teeth Intact   Pulmonary  breath sounds clear to auscultation        Cardiovascular Rhythm:regular Rate:Normal     Neuro/Psych    GI/Hepatic   Endo/Other  Morbid obesity  Renal/GU      Musculoskeletal   Abdominal   Peds  Hematology  (+) anemia ,   Anesthesia Other Findings       Reproductive/Obstetrics (+) Pregnancy                           Anesthesia Physical Anesthesia Plan  ASA: III  Anesthesia Plan: Epidural   Post-op Pain Management:    Induction:   Airway Management Planned:   Additional Equipment:   Intra-op Plan:   Post-operative Plan:   Informed Consent: I have reviewed the patients History and Physical, chart, labs and discussed the procedure including the risks, benefits and alternatives for the proposed anesthesia with the patient or authorized representative who has indicated his/her understanding and acceptance.   Dental Advisory Given  Plan Discussed with:   Anesthesia Plan Comments: (Labs checked- platelets confirmed with RN in room. Fetal heart tracing, per RN, reported to be stable enough for sitting procedure. Discussed epidural, and patient consents to the procedure:  included risk of possible headache,backache, failed block, allergic reaction, and nerve injury. This patient was asked if she had any questions or concerns before the procedure started. )        Anesthesia Quick Evaluation  

## 2012-06-12 NOTE — Anesthesia Procedure Notes (Signed)

## 2012-06-13 NOTE — Anesthesia Postprocedure Evaluation (Signed)
  Anesthesia Post-op Note  Patient: Whitney Todd  Procedure(s) Performed: * No procedures listed *  Patient Location: Mother/Baby  Anesthesia Type:Epidural  Level of Consciousness: awake and alert   Airway and Oxygen Therapy: Patient Spontanous Breathing  Post-op Pain: mild  Post-op Assessment: Patient's Cardiovascular Status Stable, Respiratory Function Stable, No signs of Nausea or vomiting, Pain level controlled, No residual numbness and No residual motor weakness  Post-op Vital Signs: Reviewed and stable  Complications: No apparent anesthesia complications

## 2012-06-13 NOTE — Progress Notes (Signed)
Post Partum Day #1 Subjective: no complaints, up ad lib and tolerating PO; bottlefeeding; desires Nexplanon for contraception  Objective: Blood pressure 131/87, pulse 83, temperature 97.9 F (36.6 C), temperature source Oral, resp. rate 18, height 5\' 7"  (1.702 m), weight 251 lb (113.853 kg), last menstrual period 09/22/2011, SpO2 99.00%, unknown if currently breastfeeding.  Physical Exam:  General: alert, cooperative and no distress Lochia: appropriate Uterine Fundus: firm DVT Evaluation: No evidence of DVT seen on physical exam.   Recent Labs  06/12/12 0758 06/12/12 1630  HGB 9.0* 9.1*  HCT 27.4* 28.0*    Assessment/Plan: Plan for discharge tomorrow- declines early d/c this afternoon   LOS: 2 days   Whitney Todd 06/13/2012, 7:22 AM

## 2012-06-14 MED ORDER — HYDROCHLOROTHIAZIDE 25 MG PO TABS: 12.5000 mg | ORAL_TABLET | Freq: Every day | ORAL | Status: DC

## 2012-06-14 MED ORDER — FERROUS SULFATE 325 (65 FE) MG PO TABS: 325.0000 mg | ORAL_TABLET | Freq: Two times a day (BID) | ORAL | Status: DC

## 2012-06-14 NOTE — MAU Provider Note (Signed)
Post Partum Day 2 Subjective: no complaints, up ad lib, voiding and tolerating PO, small lochia, bottlefeeding, abstinence until Implanon insert No H/A visual sx, abd pain Baseline BP @31  wks 151/94  Objective: Blood pressure 147/96, pulse 71, temperature 97.5 F (36.4 C), temperature source Oral, resp. rate 18, height 5\' 7"  (1.702 m), weight 251 lb (113.853 kg), last menstrual period 09/22/2011, SpO2 99.00%, unknown if currently breastfeeding. Filed Vitals:   06/12/12 2230 06/13/12 0537 06/13/12 1912 06/14/12 0536  BP: 135/85 131/87 137/87 147/96  Pulse: 94 83 71 71  Temp: 98.7 F (37.1 C) 97.9 F (36.6 C) 98 F (36.7 C) 97.5 F (36.4 C)  TempSrc: Oral Oral Oral Oral  Resp: 20 18 18 18   Height:      Weight:      SpO2: 98% 99%     Physical Exam:  General: alert, cooperative and no distress Lochia:normal flow Chest: CTAB Heart: RRR no m/r/g Abdomen: +BS, soft, nontender,  Uterine Fundus: firm DVT Evaluation: No evidence of DVT seen on physical exam. Extremities: tr  edema   Recent Labs  06/12/12 0758 06/12/12 1630  HGB 9.0* 9.1*  HCT 27.4* 28.0*    Assessment/Plan: Postpartum day 2 NSVD GHTN vs. CHTN, BPs still elevated without evidence preE Fe defic anemia RX: FeSO4 1 bid and HCTZ 12.5 mg/d. Recheck BP by [facility] nurse next week.  Discharge home and Contraception Implanon   LOS: 3 days   Charleston Hankin 06/14/2012, 9:03 AM

## 2012-06-14 NOTE — Discharge Summary (Signed)
Obstetric Discharge Summary Reason for Admission: onset of labor, gestational HTN Prenatal Procedures: NST and ultrasound Intrapartum Procedures: spontaneous vaginal delivery Postpartum Procedures: none Complications-Operative and Postpartum: none Hemoglobin  Date Value Range Status  06/12/2012 9.1* 12.0 - 15.0 g/dL Final     HCT  Date Value Range Status  06/12/2012 28.0* 36.0 - 46.0 % Final    Physical Exam:  General: alert and no distress Lochia: appropriate Uterine Fundus: firm Incision: N/A DVT Evaluation: No evidence of DVT seen on physical exam. Filed Vitals:   06/14/12 0536  BP: 147/96  Pulse: 71  Temp: 97.5 F (36.4 C)  Resp: 18  BPs 131-147/84-96  Discharge Diagnoses: Term Pregnancy-delivered  Discharge Information: Date: 06/14/2012 Activity: per booklet Diet: routine Medications: PNV, HCTZ 12.5 mg po qd; Implanon at postpartum visit Condition: stable Instructions: refer to practice specific booklet Discharge to: home Follow-up Information   Follow up with [facility]. Schedule an appointment as soon as possible for a visit in 4 weeks.   Contact information:   [address] [place] [address] [phone]    Home BP check 1 wk  Newborn Data: Live born female  Birth Weight: 7 lb 0.5 oz (3190 g) APGAR: 8, 9  Home with mother.  Whitney Todd 06/14/2012, 11:53 AM

## 2012-06-15 NOTE — Progress Notes (Signed)
Post discharge chart review completed.  

## 2013-08-14 ENCOUNTER — Emergency Department (HOSPITAL_COMMUNITY): Payer: Self-pay

## 2013-08-14 ENCOUNTER — Encounter (HOSPITAL_COMMUNITY): Payer: Self-pay | Admitting: Emergency Medicine

## 2013-08-14 ENCOUNTER — Emergency Department (HOSPITAL_COMMUNITY)
Admission: EM | Admit: 2013-08-14 | Discharge: 2013-08-14 | Disposition: A | Discharge status: Home / Self Care | Payer: Self-pay | Attending: Emergency Medicine | Admitting: Emergency Medicine

## 2013-08-14 DIAGNOSIS — Z3202 Encounter for pregnancy test, result negative: Secondary | ICD-10-CM | POA: Insufficient documentation

## 2013-08-14 DIAGNOSIS — Y9389 Activity, other specified: Secondary | ICD-10-CM | POA: Insufficient documentation

## 2013-08-14 DIAGNOSIS — S301XXA Contusion of abdominal wall, initial encounter: Secondary | ICD-10-CM | POA: Insufficient documentation

## 2013-08-14 DIAGNOSIS — R110 Nausea: Secondary | ICD-10-CM | POA: Insufficient documentation

## 2013-08-14 DIAGNOSIS — IMO0001 Reserved for inherently not codable concepts without codable children: Secondary | ICD-10-CM | POA: Insufficient documentation

## 2013-08-14 DIAGNOSIS — Y92410 Unspecified street and highway as the place of occurrence of the external cause: Secondary | ICD-10-CM | POA: Insufficient documentation

## 2013-08-14 DIAGNOSIS — Z79899 Other long term (current) drug therapy: Secondary | ICD-10-CM | POA: Insufficient documentation

## 2013-08-14 LAB — URINALYSIS, ROUTINE W REFLEX MICROSCOPIC
Bilirubin Urine: NEGATIVE
Glucose, UA: NEGATIVE mg/dL
Hgb urine dipstick: NEGATIVE
KETONES UR: NEGATIVE mg/dL
NITRITE: NEGATIVE
PH: 7.5 (ref 5.0–8.0)
PROTEIN: NEGATIVE mg/dL
Specific Gravity, Urine: 1.017 (ref 1.005–1.030)
Urobilinogen, UA: 1 mg/dL (ref 0.0–1.0)

## 2013-08-14 LAB — URINE MICROSCOPIC-ADD ON

## 2013-08-14 LAB — PREGNANCY, URINE: PREG TEST UR: NEGATIVE

## 2013-08-14 MED ORDER — IBUPROFEN 600 MG PO TABS: 600.0000 mg | ORAL_TABLET | Freq: Four times a day (QID) | ORAL | Status: DC | PRN

## 2013-08-14 MED ORDER — OXYCODONE-ACETAMINOPHEN 5-325 MG PO TABS: 1.0000 | ORAL_TABLET | Freq: Once | ORAL | Status: DC

## 2013-08-14 MED ORDER — OXYCODONE-ACETAMINOPHEN 5-325 MG PO TABS: 1.0000 | ORAL_TABLET | Freq: Four times a day (QID) | ORAL | Status: DC | PRN
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
## 2013-08-15 NOTE — ED Provider Notes (Signed)
Medical screening examination/treatment/procedure(s) were performed by non-physician practitioner and as supervising physician I was immediately available for consultation/collaboration.   EKG Interpretation None        Kristen N Ward, DO 08/15/13 0825 

## 2013-12-20 ENCOUNTER — Encounter (HOSPITAL_COMMUNITY): Payer: Self-pay | Admitting: Emergency Medicine

## 2014-02-18 NOTE — L&D Delivery Note (Signed)
Delivery Note At 1:31 AM a viable female was delivered via Vaginal, Spontaneous Delivery (Presentation: Left Occiput Anterior).  APGAR: 9, 9; weight (pending).   Placenta status: Intact, Spontaneous.  Cord: 3 vessels with the following complications: None.  Anesthesia: Epidural  Episiotomy: None Lacerations:  Small perineal abrasion Suture Repair: none Est. Blood Loss (mL): 205cc  Mom to postpartum.  Baby to Couplet care / Skin to Skin.  Upon arrival patient was complete and ready to push. She pushed with good maternal effort with 3-4 contractions with good progress to deliver a healthy baby boy without difficulty. Baby had good tone and placed on maternal abdomen for oral suctioning, drying and stimulation. Delayed cord clamping performed and cut by Family member. Placenta delivered intact with 3V cord. Vaginal canal and perineum was inspected and intact with small perineal abrasion only (not requiring sutures). Pitocin was started and uterus massaged until bleeding slowed. Counts of sharps, instruments, and lap pads were all correct.    Saralyn PilarAlexander Bay Jarquin, DO [facility], PGY-2 07/13/2014, 1:49 AM

## 2014-05-16 ENCOUNTER — Encounter (HOSPITAL_COMMUNITY): Payer: Self-pay | Admitting: *Deleted

## 2014-05-16 ENCOUNTER — Inpatient Hospital Stay (HOSPITAL_COMMUNITY)
Admission: AD | Admit: 2014-05-16 | Discharge: 2014-05-16 | Disposition: A | Discharge status: Home / Self Care | Payer: Self-pay | Source: Ambulatory Visit | Attending: Obstetrics | Admitting: Obstetrics

## 2014-05-16 DIAGNOSIS — O9989 Other specified diseases and conditions complicating pregnancy, childbirth and the puerperium: Secondary | ICD-10-CM | POA: Insufficient documentation

## 2014-05-16 DIAGNOSIS — Z3490 Encounter for supervision of normal pregnancy, unspecified, unspecified trimester: Secondary | ICD-10-CM

## 2014-05-16 DIAGNOSIS — Z3A24 24 weeks gestation of pregnancy: Secondary | ICD-10-CM | POA: Insufficient documentation

## 2014-05-16 DIAGNOSIS — Z3201 Encounter for pregnancy test, result positive: Secondary | ICD-10-CM | POA: Insufficient documentation

## 2014-05-16 NOTE — MAU Note (Signed)
No period since Oct. Has not done a test, know she is pregnant, has 2 other kids. No bleeding or leaking.

## 2014-05-16 NOTE — MAU Provider Note (Signed)
[name] is a 24 y.o. G3P2002 at 2266w5d who presents to MAU today for pregnancy verification. The patient denies abdominal pain or vaginal bleeding today. LMP early October.   BP 136/84 mmHg  Pulse 82  Temp(Src) 97.7 F (36.5 C) (Oral)  Resp 18  LMP 11/24/2013 (Approximate)  GENERAL: Well-developed, well-nourished female in no acute distress.  HEENT: Normocephalic, atraumatic.   LUNGS: Effort normal HEART: Regular rate  ABDOMEN: soft, nontender, fundal height just above the umbilicus, exam limited by maternal body habitus SKIN: Warm, dry and without erythema PSYCH: Normal mood and affect  MDM FHR - 134 bpm with doppler  A: Fetal heart tone obtained Intrauterine pregnancy ~ 20-[redacted] weeks GA  P: Discharge home Pregnancy confirmation letter Patient advised to start taking prenatal vitamins Second trimester warning signs reviewed [facility] They will call with an appointment date/time.  Outpatient US for dating and anatomy scheduled for 05/23/14 at 8:30 am Patient may return to MAU as needed or if her condition were to change or worsen   Marny LowensteinJulie N Tiyon Sanor, PA-C  05/16/2014 12:27 PM

## 2014-05-16 NOTE — Discharge Instructions (Signed)
Prenatal Care  WHAT IS PRENATAL CARE?  Prenatal care means health care during your pregnancy, before your baby is born. It is very important to take care of yourself and your baby during your pregnancy by:   Getting early prenatal care. If you know you are pregnant, or think you might be pregnant, call your health care provider as soon as possible. Schedule a visit for a prenatal exam.  Getting regular prenatal care. Follow your health care provider's schedule for blood and other necessary tests. Do not miss appointments.  Doing everything you can to keep yourself and your baby healthy during your pregnancy.  Getting complete care. Prenatal care should include evaluation of the medical, dietary, educational, psychological, and social needs of you and your significant other. The medical and genetic history of your family and the family of your baby's father should be discussed with your health care provider.  Discussing with your health care provider:  Prescription, over-the-counter, and herbal medicines that you take.  Any history of substance abuse, alcohol use, smoking, and illegal drug use.  Any history of domestic abuse and violence.  Immunizations you have received.  Your nutrition and diet.  The amount of exercise you do.  Any environmental and occupational hazards to which you are exposed.  History of sexually transmitted infections for both you and your partner.  Previous pregnancies you have had. WHY IS PRENATAL CARE SO IMPORTANT?  By regularly seeing your health care provider, you help ensure that problems can be identified early so that they can be treated as soon as possible. Other problems might be prevented. Many studies have shown that early and regular prenatal care is important for the health of mothers and their babies.  HOW CAN I TAKE CARE OF MYSELF WHILE I AM PREGNANT?  Here are ways to take care of yourself and your baby:   Start or continue taking your  multivitamin with 400 micrograms (mcg) of folic acid every day.  Get early and regular prenatal care. It is very important to see a health care provider during your pregnancy. Your health care provider will check at each visit to make sure that you and your baby are healthy. If there are any problems, action can be taken right away to help you and your baby.  Eat a healthy diet that includes:  Fruits.  Vegetables.  Foods low in saturated fat.  Whole grains.  Calcium-rich foods, such as milk, yogurt, and hard cheeses.  Drink 6-8 glasses of liquids a day.  Unless your health care provider tells you not to, try to be physically active for 30 minutes, most days of the week. If you are pressed for time, you can get your activity in through 10-minute segments, three times a day.  Do not smoke, drink alcohol, or use drugs. These can cause long-term damage to your baby. Talk with your health care provider about steps to take to stop smoking. Talk with a member of your faith community, a counselor, a trusted friend, or your health care provider if you are concerned about your alcohol or drug use.  Ask your health care provider before taking any medicine, even over-the-counter medicines. Some medicines are not safe to take during pregnancy.  Get plenty of rest and sleep.  Avoid hot tubs and saunas during pregnancy.  Do not have X-rays taken unless absolutely necessary and with the recommendation of your health care provider. A lead shield can be placed on your abdomen to protect your baby when   X-rays are taken in other parts of your body.  Do not empty the cat litter when you are pregnant. It may contain a parasite that causes an infection called toxoplasmosis, which can cause birth defects. Also, use gloves when working in garden areas used by cats.  Do not eat uncooked or undercooked meats or fish.  Do not eat soft, mold-ripened cheeses (Brie, Camembert, and chevre) or soft, blue-veined  cheese (Danish blue and Roquefort).  Stay away from toxic chemicals like:  Insecticides.  Solvents (some cleaners or paint thinners).  Lead.  Mercury.  Sexual intercourse may continue until the end of the pregnancy, unless you have a medical problem or there is a problem with the pregnancy and your health care provider tells you not to.  Do not wear high-heel shoes, especially during the second half of the pregnancy. You can lose your balance and fall.  Do not take long trips, unless absolutely necessary. Be sure to see your health care provider before going on the trip.  Do not sit in one position for more than 2 hours when on a trip.  Take a copy of your medical records when going on a trip. Know where a hospital is located in the city you are visiting, in case of an emergency.  Most dangerous household products will have pregnancy warnings on their labels. Ask your health care provider about products if you are unsure.  Limit or eliminate your caffeine intake from coffee, tea, sodas, medicines, and chocolate.  Many women continue working through pregnancy. Staying active might help you stay healthier. If you have a question about the safety or the hours you work at your particular job, talk with your health care provider.  Get informed:  Read books.  Watch videos.  Go to childbirth classes for you and your significant other.  Talk with experienced moms.  Ask your health care provider about childbirth education classes for you and your partner. Classes can help you and your partner prepare for the birth of your baby.  Ask about a baby doctor (pediatrician) and methods and pain medicine for labor, delivery, and possible cesarean delivery. HOW OFTEN SHOULD I SEE MY HEALTH CARE PROVIDER DURING PREGNANCY?  Your health care provider will give you a schedule for your prenatal visits. You will have visits more often as you get closer to the end of your pregnancy. An average  pregnancy lasts about 40 weeks.  A typical schedule includes visiting your health care provider:   About once each month during your first 6 months of pregnancy.  Every 2 weeks during the next 2 months.  Weekly in the last month, until the delivery date. Your health care provider will probably want to see you more often if:  You are older than 35 years.  Your pregnancy is high risk because you have certain health problems or problems with the pregnancy, such as:  Diabetes.  High blood pressure.  The baby is not growing on schedule, according to the dates of the pregnancy. Your health care provider will do special tests to make sure you and your baby are not having any serious problems. WHAT HAPPENS DURING PRENATAL VISITS?   At your first prenatal visit, your health care provider will do a physical exam and talk to you about your health history and the health history of your partner and your family. Your health care provider will be able to tell you what date to expect your baby to be born on.  Your   first physical exam will include checks of your blood pressure, measurements of your height and weight, and an exam of your pelvic organs. Your health care provider will do a Pap test if you have not had one recently and will do cultures of your cervix to make sure there is no infection.  At each prenatal visit, there will be tests of your blood, urine, blood pressure, weight, and the progress of the baby will be checked.  At your later prenatal visits, your health care provider will check how you are doing and how your baby is developing. You may have a number of tests done as your pregnancy progresses.  Ultrasound exams are often used to check on your baby's growth and health.  You may have more urine and blood tests, as well as special tests, if needed. These may include amniocentesis to examine fluid in the pregnancy sac, stress tests to check how the baby responds to contractions, or a  biophysical profile to measure your baby's well-being. Your health care provider will explain the tests and why they are necessary.  You should be tested for high blood sugar (gestational diabetes) between the 24th and 28th weeks of your pregnancy.  You should discuss with your health care provider your plans to breastfeed or bottle-feed your baby.  Each visit is also a chance for you to learn about staying healthy during pregnancy and to ask questions. Document Released: 02/07/2003 Document Revised: 02/09/2013 Document Reviewed: 04/21/2013 Exit[employer] Patient Information 2015 [employer]. This information is not intended to replace advice given to you by your health care provider. Make sure you discuss any questions you have with your health care provider.  

## 2014-05-23 ENCOUNTER — Other Ambulatory Visit (HOSPITAL_COMMUNITY): Payer: Self-pay | Admitting: Medical

## 2014-05-23 ENCOUNTER — Ambulatory Visit (HOSPITAL_COMMUNITY)
Admit: 2014-05-23 | Discharge: 2014-05-23 | Disposition: A | Discharge status: Home / Self Care | Payer: Self-pay | Attending: Medical | Admitting: Medical

## 2014-05-23 DIAGNOSIS — Z3490 Encounter for supervision of normal pregnancy, unspecified, unspecified trimester: Secondary | ICD-10-CM

## 2014-05-23 DIAGNOSIS — Z3689 Encounter for other specified antenatal screening: Secondary | ICD-10-CM | POA: Insufficient documentation

## 2014-06-01 ENCOUNTER — Encounter (HOSPITAL_COMMUNITY): Payer: Self-pay | Admitting: Advanced Practice Midwife

## 2014-06-01 ENCOUNTER — Inpatient Hospital Stay (HOSPITAL_COMMUNITY)
Admission: AD | Admit: 2014-06-01 | Discharge: 2014-06-01 | Disposition: A | Discharge status: Home / Self Care | Payer: Medicaid Other | Source: Ambulatory Visit | Attending: Obstetrics and Gynecology | Admitting: Obstetrics and Gynecology

## 2014-06-01 DIAGNOSIS — Z3A31 31 weeks gestation of pregnancy: Secondary | ICD-10-CM | POA: Insufficient documentation

## 2014-06-01 DIAGNOSIS — R109 Unspecified abdominal pain: Secondary | ICD-10-CM | POA: Insufficient documentation

## 2014-06-01 DIAGNOSIS — R51 Headache: Secondary | ICD-10-CM | POA: Diagnosis not present

## 2014-06-01 DIAGNOSIS — O9989 Other specified diseases and conditions complicating pregnancy, childbirth and the puerperium: Secondary | ICD-10-CM | POA: Diagnosis not present

## 2014-06-01 DIAGNOSIS — O133 Gestational [pregnancy-induced] hypertension without significant proteinuria, third trimester: Secondary | ICD-10-CM | POA: Insufficient documentation

## 2014-06-01 DIAGNOSIS — Z3A32 32 weeks gestation of pregnancy: Secondary | ICD-10-CM

## 2014-06-01 LAB — COMPREHENSIVE METABOLIC PANEL
ALBUMIN: 2.8 g/dL — AB (ref 3.5–5.2)
ALT: 11 U/L (ref 0–35)
AST: 13 U/L (ref 0–37)
Alkaline Phosphatase: 77 U/L (ref 39–117)
Anion gap: 7 (ref 5–15)
BUN: 7 mg/dL (ref 6–23)
CALCIUM: 8.9 mg/dL (ref 8.4–10.5)
CHLORIDE: 107 mmol/L (ref 96–112)
CO2: 23 mmol/L (ref 19–32)
CREATININE: 0.51 mg/dL (ref 0.50–1.10)
GFR calc Af Amer: >90 mL/min (ref >90)
GFR calc non Af Amer: >90 mL/min (ref >90)
Glucose, Bld: 89 mg/dL (ref 70–99)
Potassium: 3.2 mmol/L — ABNORMAL LOW (ref 3.5–5.1)
SODIUM: 137 mmol/L (ref 135–145)
Total Bilirubin: 0.4 mg/dL (ref 0.3–1.2)
Total Protein: 7.3 g/dL (ref 6.0–8.3)

## 2014-06-01 LAB — URINALYSIS, ROUTINE W REFLEX MICROSCOPIC
BILIRUBIN URINE: NEGATIVE
GLUCOSE, UA: NEGATIVE mg/dL
HGB URINE DIPSTICK: NEGATIVE
Ketones, ur: 15 mg/dL — AB
Nitrite: NEGATIVE
PROTEIN: NEGATIVE mg/dL
Specific Gravity, Urine: 1.02 (ref 1.005–1.030)
UROBILINOGEN UA: 1 mg/dL (ref 0.0–1.0)
pH: 6 (ref 5.0–8.0)

## 2014-06-01 LAB — CBC
HCT: 28.4 % — ABNORMAL LOW (ref 36.0–46.0)
HEMOGLOBIN: 9.5 g/dL — AB (ref 12.0–15.0)
MCH: 31.1 pg (ref 26.0–34.0)
MCHC: 33.5 g/dL (ref 30.0–36.0)
MCV: 93.1 fL (ref 78.0–100.0)
PLATELETS: 290 10*3/uL (ref 150–400)
RBC: 3.05 MIL/uL — AB (ref 3.87–5.11)
RDW: 12.4 % (ref 11.5–15.5)
WBC: 11.5 10*3/uL — AB (ref 4.0–10.5)

## 2014-06-01 LAB — URINE MICROSCOPIC-ADD ON

## 2014-06-01 LAB — PROTEIN / CREATININE RATIO, URINE
Creatinine, Urine: 132 mg/dL
Protein Creatinine Ratio: 0.08 (ref 0.00–0.15)
Total Protein, Urine: 10 mg/dL

## 2014-06-01 NOTE — MAU Note (Signed)
Pt reports right side and right lower abd pain since this am. Denies dysuria. Denies vaginal bleeding. Also reports a headache that just started. States that she vomited after she ate today but that is normal for here and has happened throughout the pregnancy.

## 2014-06-01 NOTE — Discharge Instructions (Signed)

## 2014-06-01 NOTE — MAU Provider Note (Signed)
Chief Complaint:  Headache and R abdominal pain  None    HPI: Whitney Todd is a 24 y.o. G3P2002 at [redacted]w[redacted]d by U/S who presents to maternity admissions reporting headache and R abd pain. She states that the headache and abd px started around 7 hours ago, and both have been intermittant. She rates the headache as a 6/10 at its worst and currently a 3/10. The abd pain she rates as an 8/10 and sharp, localized more to the RLQ than the RUQ. Occasional lower back pain that has been occuring since her previous pregnancies. She did endorse some dizziness that resolves when she lays down. She also noted that she had had issues with preeclampsia in her prior two pregnancies and denies any issues with hypertension outside of pregnancy. She also endorses N/V that is normal for her pregnancy. Denies contractions, leakage of fluid or vaginal bleeding. Good fetal movement.  Pregnancy Course: No PNC  Past Medical History: No past medical history on file.  Past obstetric history: OB History  Gravida Para Term Preterm AB SAB TAB Ectopic Multiple Living  0 0 0 0 0 0 2    # Outcome Date GA Lbr Len/2nd Weight Sex Delivery Anes PTL Lv  3 Current           2 Term 06/12/12 [redacted]w[redacted]d 05:49 / 00:09 3.19 kg (7 lb 0.5 oz) F Vag-Spont Other,EPI  Y  1 Term 01/28/10   3.515 kg (7 lb 12 oz) F Vag-Spont EPI N       Past Surgical History: No past surgical history on file.   Family History: Family History  Problem Relation Age of Onset  . Hypertension Mother   . Diabetes Father   . Diabetes Paternal Grandmother     Social History: History  Substance Use Topics  . Smoking status: Never Smoker   . Smokeless tobacco: Never Used  . Alcohol Use: No    Allergies: No Known Allergies  Meds:  Prescriptions prior to admission  Medication Sig Dispense Refill Last Dose  . ferrous sulfate (FERROUSUL) 325 (65 FE) MG tablet Take 1 tablet (325 mg total) by mouth 2 (two) times daily. (Patient not taking: Reported  on 06/01/2014) 60 tablet 1 Not Taking at Unknown time  . hydrochlorothiazide (HYDRODIURIL) 25 MG tablet Take 0.5 tablets (12.5 mg total) by mouth daily. (Patient not taking: Reported on 06/01/2014) 30 tablet 3 Not Taking at Unknown time  . oxyCODONE-acetaminophen (PERCOCET/ROXICET) 5-325 MG per tablet Take 1-2 tablets by mouth every 6 (six) hours as needed for severe pain. May take 2 tablets PO q 6 hours for severe pain - Do not take with Tylenol as this tablet already contains tylenol (Patient not taking: Reported on 06/01/2014) 15 tablet 0 Not Taking at Unknown time    ROS: All negative besides those noted in HPI   Physical Exam  Blood pressure 162/90, pulse 90, temperature 98.3 F (36.8 C), temperature source Oral, resp. rate 18, height  (1.727 m), weight 114.477 kg (252 lb 6 oz), last menstrual period 11/24/2013, SpO2 100 %, unknown if currently breastfeeding. GENERAL: Well-developed, well-nourished female in no acute distress.  HEART: normal rate, normal S1/S2 RESP: normal effort, CTAB GI: Abd soft, non-tender, gravid appropriate for gestational age MS: Extremities nontender, no edema, normal ROM NEURO: Alert and oriented x 4.      FHT:  Baseline 147   Labs: Results for orders placed or performed during the hospital encounter of 06/01/14 (from the past  24 hour(s))  Urinalysis, Routine w reflex microscopic     Status: Abnormal   Collection Time: 06/01/14  9:03 PM  Result Value Ref Range   Color, Urine YELLOW YELLOW   APPearance CLEAR CLEAR   Specific Gravity, Urine 1.020 1.005 - 1.030   pH 6.0 5.0 - 8.0   Glucose, UA NEGATIVE NEGATIVE mg/dL   Hgb urine dipstick NEGATIVE NEGATIVE   Bilirubin Urine NEGATIVE NEGATIVE   Ketones, ur 15 (A) NEGATIVE mg/dL   Protein, ur NEGATIVE NEGATIVE mg/dL   Urobilinogen, UA 1.0 0.0 - 1.0 mg/dL   Nitrite NEGATIVE NEGATIVE   Leukocytes, UA LARGE (A) NEGATIVE  Protein / creatinine ratio, urine     Status: None   Collection Time: 06/01/14   9:03 PM  Result Value Ref Range   Creatinine, Urine 132.00 mg/dL   Total Protein, Urine 10 mg/dL   Protein Creatinine Ratio 0.08 0.00 - 0.15  Urine microscopic-add on     Status: Abnormal   Collection Time: 06/01/14  9:03 PM  Result Value Ref Range   Squamous Epithelial / LPF FEW (A) RARE   WBC, UA 7-10 <3 WBC/hpf   RBC / HPF 0-2 <3 RBC/hpf   Bacteria, UA FEW (A) RARE  CBC     Status: Abnormal   Collection Time: 06/01/14  9:45 PM  Result Value Ref Range   WBC 11.5 (H) 4.0 - 10.5 K/uL   RBC 3.05 (L) 3.87 - 5.11 MIL/uL   Hemoglobin 9.5 (L) 12.0 - 15.0 g/dL   HCT 40.928.4 (L) 81.136.0 - 91.446.0 %   MCV 93.1 78.0 - 100.0 fL   MCH 31.1 26.0 - 34.0 pg   MCHC 33.5 30.0 - 36.0 g/dL   RDW 78.212.4 95.611.5 - 21.315.5 %   Platelets 290 150 - 400 K/uL  Comprehensive metabolic panel     Status: Abnormal   Collection Time: 06/01/14  9:45 PM  Result Value Ref Range   Sodium 137 135 - 145 mmol/L   Potassium 3.2 (L) 3.5 - 5.1 mmol/L   Chloride 107 96 - 112 mmol/L   CO2 23 19 - 32 mmol/L   Glucose, Bld 89 70 - 99 mg/dL   BUN 7 6 - 23 mg/dL   Creatinine, Ser 0.860.51 0.50 - 1.10 mg/dL   Calcium 8.9 8.4 - 57.810.5 mg/dL   Total Protein 7.3 6.0 - 8.3 g/dL   Albumin 2.8 (L) 3.5 - 5.2 g/dL   AST 13 0 - 37 U/L   ALT 11 0 - 35 U/L   Alkaline Phosphatase 77 39 - 117 U/L   Total Bilirubin 0.4 0.3 - 1.2 mg/dL   GFR calc non Af Amer >90 >90 mL/min   GFR calc Af Amer >90 >90 mL/min   Anion gap 7 5 - 15    Imaging:  Last U/S on 05/23/14 showed single IUP at 7141w6d, normal anatomy, nml AFI, posterior placenta, EFW 49%  MAU Course: Serial BP measurements CBC CMP U/A Urine Pr/Cr  Assessment: Whitney Todd is a 24 y.o. G3P2002 at 6046w1d who presents to maternity admissions reporting headache and R abd pain and concern for preeclampsia. Potential gHTN since urine Pr/Cr not elevated and CMP normal  Plan: Discharge home in stable condition.  Labor precautions and fetal kick counts F/U in clinic tomorrow   Eyvonne MechanicKevin  Applegate, Med Student 06/01/2014 10:31 PM   CNM attestation:  I have seen and examined this patient; I agree with above documentation in the medical student's note.  Whitney Todd is a 24 y.o. G3P2002 reporting H/A previously today; intermittent low abd discomfort; prev preeclampsia with both pregnancies +FM, denies LOF, VB, contractions, vaginal discharge, edema or RUQ pain  PE: BP 162/90 mmHg  Pulse 90  Temp(Src) 98.3 F (36.8 C) (Oral)  Resp 18  Ht  (1.727 m)  Wt 114.477 kg (252 lb 6 oz)  BMI 38.38 kg/m2  SpO2 100%  LMP 11/24/2013 (Approximate) BPs: 150-162/80-90s, single DBP 103 in triage room Gen: calm comfortable, NAD Resp: normal effort, no distress Abd: gravid  ROS, PMH reviewed NST reactive 135-145, +accels, no decels, occ mi variables No ctx per toco Preeclampsia labs all nl  Plan: - Rev'd preeclampsia s/s - Per Dr Emelda Fear, keep plan of her New OB visit tomorrow at [facility], [place], CNM 10:36 PM

## 2014-06-02 ENCOUNTER — Ambulatory Visit (INDEPENDENT_AMBULATORY_CARE_PROVIDER_SITE_OTHER): Payer: Self-pay | Admitting: Family Medicine

## 2014-06-02 ENCOUNTER — Encounter: Payer: Self-pay | Admitting: Family Medicine

## 2014-06-02 VITALS — BP 136/81 | HR 83 | Temp 97.7°F | Ht 70.0 in | Wt 249.7 lb

## 2014-06-02 DIAGNOSIS — Z23 Encounter for immunization: Secondary | ICD-10-CM

## 2014-06-02 DIAGNOSIS — O0933 Supervision of pregnancy with insufficient antenatal care, third trimester: Secondary | ICD-10-CM | POA: Insufficient documentation

## 2014-06-02 DIAGNOSIS — IMO0001 Reserved for inherently not codable concepts without codable children: Secondary | ICD-10-CM

## 2014-06-02 DIAGNOSIS — R030 Elevated blood-pressure reading, without diagnosis of hypertension: Secondary | ICD-10-CM

## 2014-06-02 LAB — POCT URINALYSIS DIP (DEVICE)
Glucose, UA: NEGATIVE mg/dL
NITRITE: NEGATIVE
Protein, ur: 30 mg/dL — AB
Specific Gravity, Urine: 1.02 (ref 1.005–1.030)
Urobilinogen, UA: 2 mg/dL — ABNORMAL HIGH (ref 0.0–1.0)
pH: 7 (ref 5.0–8.0)

## 2014-06-02 MED ORDER — TETANUS-DIPHTH-ACELL PERTUSSIS 5-2.5-18.5 LF-MCG/0.5 IM SUSP
0.5000 mL | Freq: Once | INTRAMUSCULAR | Status: AC
  Administered 2014-06-02: 0.5 mL via INTRAMUSCULAR

## 2014-06-02 NOTE — Progress Notes (Signed)
UA resulted small bili, trace ketones, large leukocytes, trace blood

## 2014-06-02 NOTE — Progress Notes (Signed)
Followup U/S with Radiology 06/20/14 @ 930a.

## 2014-06-02 NOTE — Progress Notes (Addendum)
   Subjective:    Whitney Todd is a [id number] 5765w2d being seen today for her first obstetrical visit.  Her obstetrical history is significant for insufficient prenatal care and elevated BP in MAU.  She had normal preeclampsia labs. Patient does not intend to breast feed. Pregnancy history fully reviewed.  Patient reports no complaints.  Filed Vitals:   06/02/14 1040 06/02/14 1130  BP: 136/81   Pulse: 83   Temp: 97.7 F (36.5 C)   Height:  5\' 10"  (1.778 m)  Weight: 249 lb 11.2 oz (113.263 kg)     HISTORY: OB History  Gravida Para Term Preterm AB SAB TAB Ectopic Multiple Living  3 2 2  0 0 0 0 0 0 2    # Outcome Date GA Lbr Len/2nd Weight Sex Delivery Anes PTL Lv  3 Current           2 Term 06/12/12 4968w5d 05:49 / 00:09 7 lb 0.5 oz (3.19 kg) F Vag-Spont Other,EPI  Y  1 Term 01/28/10   7 lb 12 oz (3.515 kg) F Vag-Spont EPI N      History reviewed. No pertinent past medical history. History reviewed. No pertinent past surgical history. Family History  Problem Relation Age of Onset  . Hypertension Mother   . Diabetes Father   . Hypertension Father   . Diabetes Paternal Grandmother      Exam    Uterus:     Pelvic Exam:    Perineum: No Hemorrhoids, Normal Perineum   Vulva: normal   Vagina:  normal mucosa       Cervix: multiparous appearance and no cervical motion tenderness       Bony Pelvis: gynecoid  System:     Skin: normal coloration and turgor, no rashes    Neurologic: oriented, normal   Extremities: normal strength, tone, and muscle mass   HEENT PERRLA and extra ocular movement intact       Neck supple and no masses   Cardiovascular: regular rate and rhythm, no murmurs or gallops   Respiratory:  appears well, vitals normal, no respiratory distress, acyanotic, normal RR, ear and throat exam is normal, neck free of mass or lymphadenopathy, chest clear, no wheezing, crepitations, rhonchi, normal symmetric air entry   Abdomen: soft, non-tender; bowel sounds  normal; no masses,  no organomegaly   Urinary: urethral meatus normal      Assessment:    Pregnancy: [id number] Patient Active Problem List   Diagnosis Date Noted  . Insufficient prenatal care in third trimester 06/02/2014  . Elevated BP 06/02/2014  . Pyuria 05/21/2012        Plan:     Initial labs drawn. Prenatal vitamins. Problem list reviewed and updated. Genetic Screening: too late for genetic screening.  Ultrasound discussed; fetal survey: results reviewed.  Follow up in 2 weeks. 100% of 30 min visit spent on counseling and coordination of care.  PAP done today   Whitney Todd, Whitney Todd 06/02/2014

## 2014-06-02 NOTE — Progress Notes (Signed)
Nutrition note: 1st visit consult Pt was obese prior to pregnancy. Pt has gained 19.7# @ 5419w2d, which is > expected. Pt reports eating ~2x/d due to nausea/ vomiting. Pt is not taking a PNV currently also due to nausea. Pt reports some nausea, vomiting & heartburn. Pt received verbal & written education on general nutrition during pregnancy. Encouraged PNV or 2 chewable multivitamins/d. Discussed tips to decrease nausea, vomiting & heartburn. Discussed importance & benefits of BF. Discussed wt gain goals of 11-20# or 0.5#/wk. Pt agrees to take PNV or equivalent. Pt does not have WIC but plans to apply. Pt does not plan to BF. F/u as needed Blondell RevealLaura Tammee Thielke, MS, RD, LDN, [facility]

## 2014-06-02 NOTE — Patient Instructions (Signed)
Third Trimester of Pregnancy The third trimester is from week 29 through week 42, months 7 through 9. The third trimester is a time when the fetus is growing rapidly. At the end of the ninth month, the fetus is about 20 inches in length and weighs 6-10 pounds.  BODY CHANGES Your body goes through many changes during pregnancy. The changes vary from woman to woman.   Your weight will continue to increase. You can expect to gain 25-35 pounds (11-16 kg) by the end of the pregnancy.  You may begin to get stretch marks on your hips, abdomen, and breasts.  You may urinate more often because the fetus is moving lower into your pelvis and pressing on your bladder.  You may develop or continue to have heartburn as a result of your pregnancy.  You may develop constipation because certain hormones are causing the muscles that push waste through your intestines to slow down.  You may develop hemorrhoids or swollen, bulging veins (varicose veins).  You may have pelvic pain because of the weight gain and pregnancy hormones relaxing your joints between the bones in your pelvis. Backaches may result from overexertion of the muscles supporting your posture.  You may have changes in your hair. These can include thickening of your hair, rapid growth, and changes in texture. Some women also have hair loss during or after pregnancy, or hair that feels dry or thin. Your hair will most likely return to normal after your baby is born.  Your breasts will continue to grow and be tender. A yellow discharge may leak from your breasts called colostrum.  Your belly button may stick out.  You may feel short of breath because of your expanding uterus.  You may notice the fetus "dropping," or moving lower in your abdomen.  You may have a bloody mucus discharge. This usually occurs a few days to a week before labor begins.  Your cervix becomes thin and soft (effaced) near your due date. WHAT TO EXPECT AT YOUR PRENATAL  EXAMS  You will have prenatal exams every 2 weeks until week 36. Then, you will have weekly prenatal exams. During a routine prenatal visit:  You will be weighed to make sure you and the fetus are growing normally.  Your blood pressure is taken.  Your abdomen will be measured to track your baby's growth.  The fetal heartbeat will be listened to.  Any test results from the previous visit will be discussed.  You may have a cervical check near your due date to see if you have effaced. At around 36 weeks, your caregiver will check your cervix. At the same time, your caregiver will also perform a test on the secretions of the vaginal tissue. This test is to determine if a type of bacteria, Group B streptococcus, is present. Your caregiver will explain this further. Your caregiver may ask you:  What your birth plan is.  How you are feeling.  If you are feeling the baby move.  If you have had any abnormal symptoms, such as leaking fluid, bleeding, severe headaches, or abdominal cramping.  If you have any questions. Other tests or screenings that may be performed during your third trimester include:  Blood tests that check for low iron levels (anemia).  Fetal testing to check the health, activity level, and growth of the fetus. Testing is done if you have certain medical conditions or if there are problems during the pregnancy. FALSE LABOR You may feel small, irregular contractions that   eventually go away. These are called Braxton Hicks contractions, or false labor. Contractions may last for hours, days, or even weeks before true labor sets in. If contractions come at regular intervals, intensify, or become painful, it is best to be seen by your caregiver.  SIGNS OF LABOR   Menstrual-like cramps.  Contractions that are 5 minutes apart or less.  Contractions that start on the top of the uterus and spread down to the lower abdomen and back.  A sense of increased pelvic pressure or back  pain.  A watery or bloody mucus discharge that comes from the vagina. If you have any of these signs before the 37th week of pregnancy, call your caregiver right away. You need to go to the hospital to get checked immediately. HOME CARE INSTRUCTIONS   Avoid all smoking, herbs, alcohol, and unprescribed drugs. These chemicals affect the formation and growth of the baby.  Follow your caregiver's instructions regarding medicine use. There are medicines that are either safe or unsafe to take during pregnancy.  Exercise only as directed by your caregiver. Experiencing uterine cramps is a good sign to stop exercising.  Continue to eat regular, healthy meals.  Wear a good support bra for breast tenderness.  Do not use hot tubs, steam rooms, or saunas.  Wear your seat belt at all times when driving.  Avoid raw meat, uncooked cheese, cat litter boxes, and soil used by cats. These carry germs that can cause birth defects in the baby.  Take your prenatal vitamins.  Try taking a stool softener (if your caregiver approves) if you develop constipation. Eat more high-fiber foods, such as fresh vegetables or fruit and whole grains. Drink plenty of fluids to keep your urine clear or pale yellow.  Take warm sitz baths to soothe any pain or discomfort caused by hemorrhoids. Use hemorrhoid cream if your caregiver approves.  If you develop varicose veins, wear support hose. Elevate your feet for 15 minutes, 3-4 times a day. Limit salt in your diet.  Avoid heavy lifting, wear low heal shoes, and practice good posture.  Rest a lot with your legs elevated if you have leg cramps or low back pain.  Visit your dentist if you have not gone during your pregnancy. Use a soft toothbrush to brush your teeth and be gentle when you floss.  A sexual relationship may be continued unless your caregiver directs you otherwise.  Do not travel far distances unless it is absolutely necessary and only with the approval  of your caregiver.  Take prenatal classes to understand, practice, and ask questions about the labor and delivery.  Make a trial run to the hospital.  Pack your hospital bag.  Prepare the baby's nursery.  Continue to go to all your prenatal visits as directed by your caregiver. SEEK MEDICAL CARE IF:  You are unsure if you are in labor or if your water has broken.  You have dizziness.  You have mild pelvic cramps, pelvic pressure, or nagging pain in your abdominal area.  You have persistent nausea, vomiting, or diarrhea.  You have a bad smelling vaginal discharge.  You have pain with urination. SEEK IMMEDIATE MEDICAL CARE IF:   You have a fever.  You are leaking fluid from your vagina.  You have spotting or bleeding from your vagina.  You have severe abdominal cramping or pain.  You have rapid weight loss or gain.  You have shortness of breath with chest pain.  You notice sudden or extreme swelling   of your face, hands, ankles, feet, or legs.  You have not felt your baby move in over an hour.  You have severe headaches that do not go away with medicine.  You have vision changes. Document Released: 01/29/2001 Document Revised: 02/09/2013 Document Reviewed: 04/07/2012 Exit[employer] Patient Information 2015 [employer]. This information is not intended to replace advice given to you by your health care provider. Make sure you discuss any questions you have with your health care provider.  

## 2014-06-02 NOTE — Progress Notes (Signed)
Initial labs and 1hr gtt today. Tdap today. Declines flu.  New OB packet given.  C/o of N/V unable to keep much down.

## 2014-06-03 LAB — PRENATAL PROFILE (SOLSTAS)
ANTIBODY SCREEN: NEGATIVE
Basophils Absolute: 0 10*3/uL (ref 0.0–0.1)
Basophils Relative: 0 % (ref 0–1)
EOS PCT: 0 % (ref 0–5)
Eosinophils Absolute: 0 10*3/uL (ref 0.0–0.7)
HCT: 29 % — ABNORMAL LOW (ref 36.0–46.0)
HIV 1&2 Ab, 4th Generation: NONREACTIVE
Hemoglobin: 9.6 g/dL — ABNORMAL LOW (ref 12.0–15.0)
Hepatitis B Surface Ag: NEGATIVE
LYMPHS ABS: 1.7 10*3/uL (ref 0.7–4.0)
Lymphocytes Relative: 17 % (ref 12–46)
MCH: 29.9 pg (ref 26.0–34.0)
MCHC: 33.1 g/dL (ref 30.0–36.0)
MCV: 90.3 fL (ref 78.0–100.0)
MPV: 9.7 fL (ref 8.6–12.4)
Monocytes Absolute: 0.4 10*3/uL (ref 0.1–1.0)
Monocytes Relative: 4 % (ref 3–12)
Neutro Abs: 8 10*3/uL — ABNORMAL HIGH (ref 1.7–7.7)
Neutrophils Relative %: 79 % — ABNORMAL HIGH (ref 43–77)
Platelets: 300 10*3/uL (ref 150–400)
RBC: 3.21 MIL/uL — AB (ref 3.87–5.11)
RDW: 13.1 % (ref 11.5–15.5)
RUBELLA: 1.3 {index} — AB (ref <0.90)
Rh Type: POSITIVE
WBC: 10.1 10*3/uL (ref 4.0–10.5)

## 2014-06-03 LAB — CULTURE, OB URINE

## 2014-06-03 LAB — GLUCOSE TOLERANCE, 1 HOUR (50G) W/O FASTING: Glucose, 1 Hour GTT: 93 mg/dL (ref 70–140)

## 2014-06-06 LAB — PRESCRIPTION MONITORING PROFILE (19 PANEL)
AMPHETAMINE/METH: NEGATIVE ng/mL
BARBITURATE SCREEN, URINE: NEGATIVE ng/mL
BENZODIAZEPINE SCREEN, URINE: NEGATIVE ng/mL
Buprenorphine, Urine: NEGATIVE ng/mL
COCAINE METABOLITES: NEGATIVE ng/mL
Carisoprodol, Urine: NEGATIVE ng/mL
Creatinine, Urine: 258.05 mg/dL (ref >20.0)
Fentanyl, Ur: NEGATIVE ng/mL
MDMA URINE: NEGATIVE ng/mL
Meperidine, Ur: NEGATIVE ng/mL
Methadone Screen, Urine: NEGATIVE ng/mL
Methaqualone: NEGATIVE ng/mL
NITRITES URINE, INITIAL: NEGATIVE ug/mL
OPIATE SCREEN, URINE: NEGATIVE ng/mL
Oxycodone Screen, Ur: NEGATIVE ng/mL
PH URINE, INITIAL: 8.1 pH (ref 4.5–8.9)
PHENCYCLIDINE, UR: NEGATIVE ng/mL
Propoxyphene: NEGATIVE ng/mL
Tapentadol, urine: NEGATIVE ng/mL
Tramadol Scrn, Ur: NEGATIVE ng/mL
Zolpidem, Urine: NEGATIVE ng/mL

## 2014-06-06 LAB — CYTOLOGY - PAP

## 2014-06-06 LAB — CANNABANOIDS (GC/LC/MS), URINE

## 2014-06-09 ENCOUNTER — Encounter: Payer: Self-pay | Admitting: Obstetrics & Gynecology

## 2014-06-09 DIAGNOSIS — IMO0002 Reserved for concepts with insufficient information to code with codable children: Secondary | ICD-10-CM | POA: Insufficient documentation

## 2014-06-09 DIAGNOSIS — F1910 Other psychoactive substance abuse, uncomplicated: Secondary | ICD-10-CM | POA: Insufficient documentation

## 2014-06-16 ENCOUNTER — Ambulatory Visit (INDEPENDENT_AMBULATORY_CARE_PROVIDER_SITE_OTHER): Payer: Self-pay | Admitting: Obstetrics & Gynecology

## 2014-06-16 ENCOUNTER — Encounter: Payer: Self-pay | Admitting: Obstetrics & Gynecology

## 2014-06-16 VITALS — BP 136/90 | HR 97 | Wt 252.0 lb

## 2014-06-16 DIAGNOSIS — O0933 Supervision of pregnancy with insufficient antenatal care, third trimester: Secondary | ICD-10-CM

## 2014-06-16 LAB — POCT URINALYSIS DIP (DEVICE)
BILIRUBIN URINE: NEGATIVE
Glucose, UA: NEGATIVE mg/dL
KETONES UR: 15 mg/dL — AB
Nitrite: NEGATIVE
PH: 7 (ref 5.0–8.0)
Protein, ur: 30 mg/dL — AB
Specific Gravity, Urine: 1.02 (ref 1.005–1.030)
Urobilinogen, UA: 1 mg/dL (ref 0.0–1.0)

## 2014-06-16 NOTE — Progress Notes (Signed)
Large leuks and trace Hbg on udip

## 2014-06-16 NOTE — Progress Notes (Signed)
Will begin weekly visits.

## 2014-06-16 NOTE — Patient Instructions (Signed)
Third Trimester of Pregnancy The third trimester is from week 29 through week 42, months 7 through 9. The third trimester is a time when the fetus is growing rapidly. At the end of the ninth month, the fetus is about 20 inches in length and weighs 6-10 pounds.  BODY CHANGES Your body goes through many changes during pregnancy. The changes vary from woman to woman.   Your weight will continue to increase. You can expect to gain 25-35 pounds (11-16 kg) by the end of the pregnancy.  You may begin to get stretch marks on your hips, abdomen, and breasts.  You may urinate more often because the fetus is moving lower into your pelvis and pressing on your bladder.  You may develop or continue to have heartburn as a result of your pregnancy.  You may develop constipation because certain hormones are causing the muscles that push waste through your intestines to slow down.  You may develop hemorrhoids or swollen, bulging veins (varicose veins).  You may have pelvic pain because of the weight gain and pregnancy hormones relaxing your joints between the bones in your pelvis. Backaches may result from overexertion of the muscles supporting your posture.  You may have changes in your hair. These can include thickening of your hair, rapid growth, and changes in texture. Some women also have hair loss during or after pregnancy, or hair that feels dry or thin. Your hair will most likely return to normal after your baby is born.  Your breasts will continue to grow and be tender. A yellow discharge may leak from your breasts called colostrum.  Your belly button may stick out.  You may feel short of breath because of your expanding uterus.  You may notice the fetus "dropping," or moving lower in your abdomen.  You may have a bloody mucus discharge. This usually occurs a few days to a week before labor begins.  Your cervix becomes thin and soft (effaced) near your due date. WHAT TO EXPECT AT YOUR PRENATAL  EXAMS  You will have prenatal exams every 2 weeks until week 36. Then, you will have weekly prenatal exams. During a routine prenatal visit:  You will be weighed to make sure you and the fetus are growing normally.  Your blood pressure is taken.  Your abdomen will be measured to track your baby's growth.  The fetal heartbeat will be listened to.  Any test results from the previous visit will be discussed.  You may have a cervical check near your due date to see if you have effaced. At around 36 weeks, your caregiver will check your cervix. At the same time, your caregiver will also perform a test on the secretions of the vaginal tissue. This test is to determine if a type of bacteria, Group B streptococcus, is present. Your caregiver will explain this further. Your caregiver may ask you:  What your birth plan is.  How you are feeling.  If you are feeling the baby move.  If you have had any abnormal symptoms, such as leaking fluid, bleeding, severe headaches, or abdominal cramping.  If you have any questions. Other tests or screenings that may be performed during your third trimester include:  Blood tests that check for low iron levels (anemia).  Fetal testing to check the health, activity level, and growth of the fetus. Testing is done if you have certain medical conditions or if there are problems during the pregnancy. FALSE LABOR You may feel small, irregular contractions that   eventually go away. These are called Braxton Hicks contractions, or false labor. Contractions may last for hours, days, or even weeks before true labor sets in. If contractions come at regular intervals, intensify, or become painful, it is best to be seen by your caregiver.  SIGNS OF LABOR   Menstrual-like cramps.  Contractions that are 5 minutes apart or less.  Contractions that start on the top of the uterus and spread down to the lower abdomen and back.  A sense of increased pelvic pressure or back  pain.  A watery or bloody mucus discharge that comes from the vagina. If you have any of these signs before the 37th week of pregnancy, call your caregiver right away. You need to go to the hospital to get checked immediately. HOME CARE INSTRUCTIONS   Avoid all smoking, herbs, alcohol, and unprescribed drugs. These chemicals affect the formation and growth of the baby.  Follow your caregiver's instructions regarding medicine use. There are medicines that are either safe or unsafe to take during pregnancy.  Exercise only as directed by your caregiver. Experiencing uterine cramps is a good sign to stop exercising.  Continue to eat regular, healthy meals.  Wear a good support bra for breast tenderness.  Do not use hot tubs, steam rooms, or saunas.  Wear your seat belt at all times when driving.  Avoid raw meat, uncooked cheese, cat litter boxes, and soil used by cats. These carry germs that can cause birth defects in the baby.  Take your prenatal vitamins.  Try taking a stool softener (if your caregiver approves) if you develop constipation. Eat more high-fiber foods, such as fresh vegetables or fruit and whole grains. Drink plenty of fluids to keep your urine clear or pale yellow.  Take warm sitz baths to soothe any pain or discomfort caused by hemorrhoids. Use hemorrhoid cream if your caregiver approves.  If you develop varicose veins, wear support hose. Elevate your feet for 15 minutes, 3-4 times a day. Limit salt in your diet.  Avoid heavy lifting, wear low heal shoes, and practice good posture.  Rest a lot with your legs elevated if you have leg cramps or low back pain.  Visit your dentist if you have not gone during your pregnancy. Use a soft toothbrush to brush your teeth and be gentle when you floss.  A sexual relationship may be continued unless your caregiver directs you otherwise.  Do not travel far distances unless it is absolutely necessary and only with the approval  of your caregiver.  Take prenatal classes to understand, practice, and ask questions about the labor and delivery.  Make a trial run to the hospital.  Pack your hospital bag.  Prepare the baby's nursery.  Continue to go to all your prenatal visits as directed by your caregiver. SEEK MEDICAL CARE IF:  You are unsure if you are in labor or if your water has broken.  You have dizziness.  You have mild pelvic cramps, pelvic pressure, or nagging pain in your abdominal area.  You have persistent nausea, vomiting, or diarrhea.  You have a bad smelling vaginal discharge.  You have pain with urination. SEEK IMMEDIATE MEDICAL CARE IF:   You have a fever.  You are leaking fluid from your vagina.  You have spotting or bleeding from your vagina.  You have severe abdominal cramping or pain.  You have rapid weight loss or gain.  You have shortness of breath with chest pain.  You notice sudden or extreme swelling   of your face, hands, ankles, feet, or legs.  You have not felt your baby move in over an hour.  You have severe headaches that do not go away with medicine.  You have vision changes. Document Released: 01/29/2001 Document Revised: 02/09/2013 Document Reviewed: 04/07/2012 Exit[employer] Patient Information 2015 [employer]. This information is not intended to replace advice given to you by your health care provider. Make sure you discuss any questions you have with your health care provider.  

## 2014-06-19 LAB — CANNABANOIDS (GC/LC/MS), URINE: THC-COOH (GC/LC/MS), ur confirm: 145 ng/mL — AB

## 2014-06-20 ENCOUNTER — Ambulatory Visit (HOSPITAL_COMMUNITY): Payer: Self-pay

## 2014-06-20 ENCOUNTER — Ambulatory Visit (HOSPITAL_COMMUNITY)
Admission: RE | Admit: 2014-06-20 | Discharge: 2014-06-20 | Disposition: A | Discharge status: Home / Self Care | Payer: Medicaid Other | Source: Ambulatory Visit | Attending: Family Medicine | Admitting: Family Medicine

## 2014-06-20 DIAGNOSIS — Z36 Encounter for antenatal screening of mother: Secondary | ICD-10-CM | POA: Diagnosis present

## 2014-06-20 DIAGNOSIS — O0933 Supervision of pregnancy with insufficient antenatal care, third trimester: Secondary | ICD-10-CM

## 2014-06-20 DIAGNOSIS — Z3A33 33 weeks gestation of pregnancy: Secondary | ICD-10-CM | POA: Diagnosis not present

## 2014-06-21 LAB — PRESCRIPTION MONITORING PROFILE (SOLSTAS)
Amphetamine/Meth: NEGATIVE ng/mL
BARBITURATE SCREEN, URINE: NEGATIVE ng/mL
Benzodiazepine Screen, Urine: NEGATIVE ng/mL
Buprenorphine, Urine: NEGATIVE ng/mL
Carisoprodol, Urine: NEGATIVE ng/mL
Cocaine Metabolites: NEGATIVE ng/mL
Creatinine, Urine: 174 mg/dL (ref >20.0)
Fentanyl, Ur: NEGATIVE ng/mL
MDMA URINE: NEGATIVE ng/mL
Meperidine, Ur: NEGATIVE ng/mL
Methadone Screen, Urine: NEGATIVE ng/mL
Nitrites, Initial: NEGATIVE ug/mL
OPIATE SCREEN, URINE: NEGATIVE ng/mL
OXYCODONE SCRN UR: NEGATIVE ng/mL
Propoxyphene: NEGATIVE ng/mL
TAPENTADOLUR: NEGATIVE ng/mL
TRAMADOL UR: NEGATIVE ng/mL
Zolpidem, Urine: NEGATIVE ng/mL
pH, Initial: 8.2 pH (ref 4.5–8.9)

## 2014-06-23 ENCOUNTER — Ambulatory Visit (INDEPENDENT_AMBULATORY_CARE_PROVIDER_SITE_OTHER): Payer: Self-pay | Admitting: Family

## 2014-06-23 VITALS — BP 133/76 | HR 83 | Wt 250.7 lb

## 2014-06-23 DIAGNOSIS — R8271 Bacteriuria: Secondary | ICD-10-CM

## 2014-06-23 DIAGNOSIS — O139 Gestational [pregnancy-induced] hypertension without significant proteinuria, unspecified trimester: Secondary | ICD-10-CM

## 2014-06-23 DIAGNOSIS — O133 Gestational [pregnancy-induced] hypertension without significant proteinuria, third trimester: Secondary | ICD-10-CM

## 2014-06-23 DIAGNOSIS — O0933 Supervision of pregnancy with insufficient antenatal care, third trimester: Secondary | ICD-10-CM

## 2014-06-23 DIAGNOSIS — O2343 Unspecified infection of urinary tract in pregnancy, third trimester: Secondary | ICD-10-CM

## 2014-06-23 LAB — POCT URINALYSIS DIP (DEVICE)
BILIRUBIN URINE: NEGATIVE
Glucose, UA: NEGATIVE mg/dL
Ketones, ur: NEGATIVE mg/dL
Nitrite: NEGATIVE
Protein, ur: NEGATIVE mg/dL
Specific Gravity, Urine: 1.015 (ref 1.005–1.030)
Urobilinogen, UA: 0.2 mg/dL (ref 0.0–1.0)
pH: 7 (ref 5.0–8.0)

## 2014-06-23 MED ORDER — NITROFURANTOIN MONOHYD MACRO 100 MG PO CAPS: 100.0000 mg | ORAL_CAPSULE | Freq: Two times a day (BID) | ORAL | Status: DC

## 2014-06-23 NOTE — Progress Notes (Signed)
Pt reports back pain that radiates to her thighs. Also having braxton hicks contractions.

## 2014-06-23 NOTE — Progress Notes (Signed)
Hgb: Trace, Leukocytes: Large

## 2014-06-23 NOTE — Progress Notes (Signed)
Upon review of records patient had elevated blood pressure in MAU on 06/01/14 162/90.  Upon review of prior BPs, pt had an elevated BP  On 4/28 > gestational hypertension.  Hx of gestational hypertension with prior two pregnancies.  Begin testing.  Reports back pain that has been present for one month.  Denies vaginal bleeding or LOF.  Denies UTI symptoms.  Large leuks in urine, RX Macrobid.  Urine sent for culture.

## 2014-06-23 NOTE — Addendum Note (Signed)
Addended by: Kathee DeltonHILLMAN, Latese Dufault L on: 06/23/2014 10:33 AM   Modules accepted: Orders

## 2014-06-23 NOTE — Addendum Note (Signed)
Addended by: Jill SideAY, Teaira Croft L on: 06/23/2014 10:38 AM   Modules accepted: Level of Service

## 2014-06-24 LAB — CULTURE, OB URINE: Colony Count: 80000

## 2014-06-28 ENCOUNTER — Other Ambulatory Visit: Payer: Self-pay

## 2014-06-28 ENCOUNTER — Telehealth: Payer: Self-pay | Admitting: *Deleted

## 2014-06-28 NOTE — Telephone Encounter (Signed)
Called pt regarding missed appt today @ 1000. I left message that her appt is important. She can be seen today @ 1, 2 or 3pm.  Please call back to the appt line if she is able to come in at any of these times.

## 2014-06-30 ENCOUNTER — Ambulatory Visit (INDEPENDENT_AMBULATORY_CARE_PROVIDER_SITE_OTHER): Payer: Self-pay | Admitting: Obstetrics and Gynecology

## 2014-06-30 ENCOUNTER — Other Ambulatory Visit: Payer: Self-pay | Admitting: Obstetrics and Gynecology

## 2014-06-30 ENCOUNTER — Encounter: Payer: Self-pay | Admitting: Obstetrics and Gynecology

## 2014-06-30 VITALS — BP 148/94 | HR 101 | Wt 252.4 lb

## 2014-06-30 DIAGNOSIS — O133 Gestational [pregnancy-induced] hypertension without significant proteinuria, third trimester: Secondary | ICD-10-CM

## 2014-06-30 DIAGNOSIS — IMO0002 Reserved for concepts with insufficient information to code with codable children: Secondary | ICD-10-CM

## 2014-06-30 DIAGNOSIS — O139 Gestational [pregnancy-induced] hypertension without significant proteinuria, unspecified trimester: Secondary | ICD-10-CM

## 2014-06-30 DIAGNOSIS — O99320 Drug use complicating pregnancy, unspecified trimester: Secondary | ICD-10-CM

## 2014-06-30 DIAGNOSIS — F1910 Other psychoactive substance abuse, uncomplicated: Secondary | ICD-10-CM

## 2014-06-30 DIAGNOSIS — O0933 Supervision of pregnancy with insufficient antenatal care, third trimester: Secondary | ICD-10-CM

## 2014-06-30 LAB — POCT URINALYSIS DIP (DEVICE)
Bilirubin Urine: NEGATIVE
Glucose, UA: NEGATIVE mg/dL
Hgb urine dipstick: NEGATIVE
NITRITE: NEGATIVE
PH: 6.5 (ref 5.0–8.0)
Protein, ur: NEGATIVE mg/dL
Specific Gravity, Urine: 1.015 (ref 1.005–1.030)
UROBILINOGEN UA: 1 mg/dL (ref 0.0–1.0)

## 2014-06-30 LAB — OB RESULTS CONSOLE GBS: GBS: NEGATIVE

## 2014-06-30 LAB — OB RESULTS CONSOLE GC/CHLAMYDIA
Chlamydia: NEGATIVE
Gonorrhea: NEGATIVE

## 2014-06-30 NOTE — Progress Notes (Signed)
Patient is doing well without complaints. She denies HA, visual disturbances, RUQ/epigastric pain, nausea or emesis. FM/PTL precautions reviewed. Cultures collected today. Patient will be scheduled for IOL at 37 weeks [facility] and [facility]

## 2014-06-30 NOTE — Progress Notes (Signed)
Pt denies H/A or visual disturbances now but states that she had a headache and seeing spots yesterday which resolved with sleep. Pt has sx of URI vs allergies today - nasal congestion and cough.

## 2014-07-01 LAB — GC/CHLAMYDIA PROBE AMP
CT PROBE, AMP APTIMA: NEGATIVE
GC Probe RNA: NEGATIVE

## 2014-07-02 LAB — CULTURE, BETA STREP (GROUP B ONLY)

## 2014-07-05 ENCOUNTER — Other Ambulatory Visit: Payer: Self-pay

## 2014-07-05 ENCOUNTER — Telehealth: Payer: Self-pay | Admitting: *Deleted

## 2014-07-05 NOTE — Telephone Encounter (Signed)
Called pt regarding her missed appt today @ 1000. I left a message stating that she may come in for her appt @ 1300 today. If she has questions, please call back and leave a message on the nurse voice mail.

## 2014-07-07 ENCOUNTER — Telehealth (HOSPITAL_COMMUNITY): Payer: Self-pay | Admitting: *Deleted

## 2014-07-07 ENCOUNTER — Ambulatory Visit (INDEPENDENT_AMBULATORY_CARE_PROVIDER_SITE_OTHER): Payer: Self-pay | Admitting: Obstetrics & Gynecology

## 2014-07-07 ENCOUNTER — Encounter (HOSPITAL_COMMUNITY): Payer: Self-pay | Admitting: *Deleted

## 2014-07-07 VITALS — BP 140/91 | HR 95 | Wt 252.1 lb

## 2014-07-07 DIAGNOSIS — O139 Gestational [pregnancy-induced] hypertension without significant proteinuria, unspecified trimester: Secondary | ICD-10-CM

## 2014-07-07 LAB — POCT URINALYSIS DIP (DEVICE)
BILIRUBIN URINE: NEGATIVE
Glucose, UA: NEGATIVE mg/dL
Ketones, ur: NEGATIVE mg/dL
Nitrite: NEGATIVE
Protein, ur: 30 mg/dL — AB
Specific Gravity, Urine: 1.02 (ref 1.005–1.030)
Urobilinogen, UA: 0.2 mg/dL (ref 0.0–1.0)
pH: 7 (ref 5.0–8.0)

## 2014-07-07 NOTE — Patient Instructions (Signed)

## 2014-07-07 NOTE — Telephone Encounter (Signed)
Preadmission screen  

## 2014-07-07 NOTE — Progress Notes (Signed)
Large leukocytes and trace Hgb on udip today. Pt reports more frequent headaches and occasional scotomata.  The headaches are relieved with Tylenol and/or sleep. She denies H/A or visual disturbances presently.  IOL scheduled 5/24 @ 0730.

## 2014-07-07 NOTE — Progress Notes (Signed)
BP stable not severe range, will give precautions and have patient keep appt for IOL in 5 days, NST reactive today and AFI nl

## 2014-07-12 ENCOUNTER — Inpatient Hospital Stay (HOSPITAL_COMMUNITY): Payer: Medicaid Other | Admitting: Anesthesiology

## 2014-07-12 ENCOUNTER — Inpatient Hospital Stay (HOSPITAL_COMMUNITY)
Admission: RE | Admit: 2014-07-12 | Discharge: 2014-07-14 | DRG: 775 | Disposition: A | Discharge status: Home / Self Care | Payer: Medicaid Other | Source: Ambulatory Visit | Attending: Family Medicine | Admitting: Family Medicine

## 2014-07-12 ENCOUNTER — Encounter (HOSPITAL_COMMUNITY): Payer: Self-pay

## 2014-07-12 VITALS — BP 147/78 | HR 73 | Temp 99.0°F | Resp 18 | Ht 70.0 in | Wt 252.0 lb

## 2014-07-12 DIAGNOSIS — Z3A37 37 weeks gestation of pregnancy: Secondary | ICD-10-CM | POA: Diagnosis present

## 2014-07-12 DIAGNOSIS — O133 Gestational [pregnancy-induced] hypertension without significant proteinuria, third trimester: Secondary | ICD-10-CM | POA: Diagnosis present

## 2014-07-12 DIAGNOSIS — O99320 Drug use complicating pregnancy, unspecified trimester: Secondary | ICD-10-CM

## 2014-07-12 DIAGNOSIS — O169 Unspecified maternal hypertension, unspecified trimester: Secondary | ICD-10-CM | POA: Diagnosis present

## 2014-07-12 DIAGNOSIS — O139 Gestational [pregnancy-induced] hypertension without significant proteinuria, unspecified trimester: Secondary | ICD-10-CM

## 2014-07-12 DIAGNOSIS — Z3483 Encounter for supervision of other normal pregnancy, third trimester: Secondary | ICD-10-CM | POA: Diagnosis present

## 2014-07-12 DIAGNOSIS — IMO0002 Reserved for concepts with insufficient information to code with codable children: Secondary | ICD-10-CM

## 2014-07-12 LAB — CBC
HCT: 29.6 % — ABNORMAL LOW (ref 36.0–46.0)
HEMATOCRIT: 27.9 % — AB (ref 36.0–46.0)
Hemoglobin: 9.1 g/dL — ABNORMAL LOW (ref 12.0–15.0)
Hemoglobin: 9.7 g/dL — ABNORMAL LOW (ref 12.0–15.0)
MCH: 29.4 pg (ref 26.0–34.0)
MCH: 29.4 pg (ref 26.0–34.0)
MCHC: 32.6 g/dL (ref 30.0–36.0)
MCHC: 32.8 g/dL (ref 30.0–36.0)
MCV: 90 fL (ref 78.0–100.0)
MCV: 89.7 fL (ref 78.0–100.0)
PLATELETS: 283 10*3/uL (ref 150–400)
Platelets: 292 10*3/uL (ref 150–400)
RBC: 3.1 MIL/uL — AB (ref 3.87–5.11)
RBC: 3.3 MIL/uL — ABNORMAL LOW (ref 3.87–5.11)
RDW: 13 % (ref 11.5–15.5)
RDW: 12.9 % (ref 11.5–15.5)
WBC: 10.8 10*3/uL — ABNORMAL HIGH (ref 4.0–10.5)
WBC: 14.5 10*3/uL — ABNORMAL HIGH (ref 4.0–10.5)

## 2014-07-12 LAB — RAPID URINE DRUG SCREEN, HOSP PERFORMED
AMPHETAMINES: NOT DETECTED
BARBITURATES: NOT DETECTED
Benzodiazepines: NOT DETECTED
COCAINE: NOT DETECTED
Opiates: NOT DETECTED
TETRAHYDROCANNABINOL: NOT DETECTED

## 2014-07-12 LAB — HIV ANTIBODY (ROUTINE TESTING W REFLEX): HIV Screen 4th Generation wRfx: NONREACTIVE

## 2014-07-12 LAB — COMPREHENSIVE METABOLIC PANEL
ALT: 18 U/L (ref 14–54)
AST: 21 U/L (ref 15–41)
Albumin: 2.5 g/dL — ABNORMAL LOW (ref 3.5–5.0)
Alkaline Phosphatase: 112 U/L (ref 38–126)
Anion gap: 8 (ref 5–15)
BUN: 8 mg/dL (ref 6–20)
CHLORIDE: 106 mmol/L (ref 101–111)
CO2: 22 mmol/L (ref 22–32)
CREATININE: 0.57 mg/dL (ref 0.44–1.00)
Calcium: 8.7 mg/dL — ABNORMAL LOW (ref 8.9–10.3)
Glucose, Bld: 104 mg/dL — ABNORMAL HIGH (ref 65–99)
Potassium: 3.2 mmol/L — ABNORMAL LOW (ref 3.5–5.1)
Sodium: 136 mmol/L (ref 135–145)
Total Bilirubin: 0.1 mg/dL — ABNORMAL LOW (ref 0.3–1.2)
Total Protein: 6.7 g/dL (ref 6.5–8.1)

## 2014-07-12 LAB — TYPE AND SCREEN
ABO/RH(D): A POS
Antibody Screen: NEGATIVE

## 2014-07-12 LAB — PROTEIN / CREATININE RATIO, URINE
CREATININE, URINE: 45 mg/dL
PROTEIN CREATININE RATIO: 0.82 mg/mg{creat} — AB (ref 0.00–0.15)
TOTAL PROTEIN, URINE: 37 mg/dL

## 2014-07-12 LAB — RPR: RPR Ser Ql: NONREACTIVE

## 2014-07-12 MED ORDER — OXYTOCIN 40 UNITS IN LACTATED RINGERS INFUSION - SIMPLE MED
INTRAVENOUS | Status: AC
  Administered 2014-07-12: 2 m[IU]/min via INTRAVENOUS
  Filled 2014-07-12: qty 1000

## 2014-07-12 MED ORDER — FENTANYL CITRATE (PF) 100 MCG/2ML IJ SOLN
100.0000 ug | INTRAMUSCULAR | Status: DC | PRN
  Administered 2014-07-12: 100 ug via INTRAVENOUS
  Filled 2014-07-12: qty 2

## 2014-07-12 MED ORDER — EPHEDRINE 5 MG/ML INJ
10.0000 mg | INTRAVENOUS | Status: DC | PRN
  Filled 2014-07-12: qty 2

## 2014-07-12 MED ORDER — DIPHENHYDRAMINE HCL 50 MG/ML IJ SOLN
12.5000 mg | INTRAMUSCULAR | Status: AC | PRN
  Administered 2014-07-12 (×3): 12.5 mg via INTRAVENOUS
  Filled 2014-07-12 (×3): qty 1

## 2014-07-12 MED ORDER — LIDOCAINE HCL (PF) 1 % IJ SOLN
30.0000 mL | INTRAMUSCULAR | Status: DC | PRN
  Filled 2014-07-12: qty 30

## 2014-07-12 MED ORDER — EPHEDRINE 5 MG/ML INJ: 10.0000 mg | INTRAVENOUS | Status: DC | PRN

## 2014-07-12 MED ORDER — OXYCODONE-ACETAMINOPHEN 5-325 MG PO TABS: 2.0000 | ORAL_TABLET | ORAL | Status: DC | PRN

## 2014-07-12 MED ORDER — LACTATED RINGERS IV SOLN
500.0000 mL | INTRAVENOUS | Status: DC | PRN
Start: 2014-07-12 — End: 2014-07-13
  Administered 2014-07-12 (×2): 1000 mL via INTRAVENOUS
  Administered 2014-07-13: 750 mL via INTRAVENOUS

## 2014-07-12 MED ORDER — OXYTOCIN 40 UNITS IN LACTATED RINGERS INFUSION - SIMPLE MED
1.0000 m[IU]/min | INTRAVENOUS | Status: DC
  Administered 2014-07-12: 2 m[IU]/min via INTRAVENOUS

## 2014-07-12 MED ORDER — ONDANSETRON HCL 4 MG/2ML IJ SOLN: 4.0000 mg | Freq: Four times a day (QID) | INTRAMUSCULAR | Status: DC | PRN

## 2014-07-12 MED ORDER — OXYTOCIN BOLUS FROM INFUSION
500.0000 mL | INTRAVENOUS | Status: DC
  Administered 2014-07-13: 500 mL via INTRAVENOUS

## 2014-07-12 MED ORDER — FENTANYL 2.5 MCG/ML BUPIVACAINE 1/10 % EPIDURAL INFUSION (WH - ANES): 14.0000 mL/h | INTRAMUSCULAR | Status: DC | PRN

## 2014-07-12 MED ORDER — LIDOCAINE HCL (PF) 1 % IJ SOLN
INTRAMUSCULAR | Status: DC | PRN
  Administered 2014-07-12 (×2): 4 mL

## 2014-07-12 MED ORDER — FENTANYL 2.5 MCG/ML BUPIVACAINE 1/10 % EPIDURAL INFUSION (WH - ANES)
14.0000 mL/h | INTRAMUSCULAR | Status: DC | PRN
  Administered 2014-07-12: 14 mL/h via EPIDURAL
  Filled 2014-07-12 (×2): qty 125

## 2014-07-12 MED ORDER — TERBUTALINE SULFATE 1 MG/ML IJ SOLN: 0.2500 mg | Freq: Once | INTRAMUSCULAR | Status: AC | PRN

## 2014-07-12 MED ORDER — OXYCODONE-ACETAMINOPHEN 5-325 MG PO TABS: 1.0000 | ORAL_TABLET | ORAL | Status: DC | PRN

## 2014-07-12 MED ORDER — PHENYLEPHRINE 40 MCG/ML (10ML) SYRINGE FOR IV PUSH (FOR BLOOD PRESSURE SUPPORT): 80.0000 ug | PREFILLED_SYRINGE | INTRAVENOUS | Status: DC | PRN

## 2014-07-12 MED ORDER — ACETAMINOPHEN 325 MG PO TABS: 650.0000 mg | ORAL_TABLET | ORAL | Status: DC | PRN

## 2014-07-12 MED ORDER — LACTATED RINGERS IV SOLN
INTRAVENOUS | Status: DC
  Administered 2014-07-12 – 2014-07-13 (×4): via INTRAVENOUS

## 2014-07-12 MED ORDER — OXYTOCIN 40 UNITS IN LACTATED RINGERS INFUSION - SIMPLE MED: 62.5000 mL/h | INTRAVENOUS | Status: DC

## 2014-07-12 MED ORDER — PHENYLEPHRINE 40 MCG/ML (10ML) SYRINGE FOR IV PUSH (FOR BLOOD PRESSURE SUPPORT)
80.0000 ug | PREFILLED_SYRINGE | INTRAVENOUS | Status: DC | PRN
  Filled 2014-07-12: qty 2
  Filled 2014-07-12: qty 20

## 2014-07-12 MED ORDER — CITRIC ACID-SODIUM CITRATE 334-500 MG/5ML PO SOLN: 30.0000 mL | ORAL | Status: DC | PRN

## 2014-07-12 MED ORDER — LABETALOL HCL 5 MG/ML IV SOLN
10.0000 mg | Freq: Once | INTRAVENOUS | Status: AC
  Administered 2014-07-12: 10 mg via INTRAVENOUS
  Filled 2014-07-12: qty 4

## 2014-07-12 NOTE — Progress Notes (Signed)
Subjective:  Has epidural in place. Reports some increased pelvic cramping pain. Requests higher dose of epidural. No other concerns. - Denies LOF, VB, HA, RUQ/epigastric abd pain, swelling.  Objective: BP 138/68 mmHg  Pulse 101  Temp(Src) 98.8 F (37.1 C) (Oral)  Resp 18  Ht 5\' 10"  (1.778 m)  Wt 114.306 kg (252 lb)  BMI 36.16 kg/m2  SpO2 100%  LMP 11/24/2013 (Approximate) I/O last 3 completed shifts: In: -  Out: 150 [Urine:150]     FHT:  FHR: 140 bpm, variability: minimal ,  accelerations:  Present (15x15),  decelerations:  Absent UC:   regular, every 2-3 minutes SVE:   Dilation: 5.5 Effacement (%): 50, 60 Station: -2 Exam by:: e. poore, rn  Labs: Lab Results  Component Value Date   WBC 14.5* 07/12/2014   HGB 9.7* 07/12/2014   HCT 29.6* 07/12/2014   MCV 89.7 07/12/2014   PLT 292 07/12/2014    Assessment / Plan: Jacqlyn KraussVontasia J Bruski is a 24 y.o. G3P2002 at 623w0d by 3rd trimester US admitted for IOL for gHTN, now diagnosed with Pre-Eclampsia, with some severe range BPs, since improved.  Labor: Augmentation with Pit. Initially s/p FB out 2 hrs ago. Currently without significant cervical change since last check. Anticipate AROM in about 2 hrs if still no change on max Pit. Preeclampsia:  no signs or symptoms of toxicity and labs stable. Dx tonight Pre-E with elevated UPC 0.82. Had several hrs of severe range BP, since improved on Labetalol. If develops persistent severe range BP again, will initiate mag therapy with 4g loading dose and 2g infusion. Fetal Wellbeing:  Category I Pain Control:  Epidural I/D:  GBS negative Anticipated MOD:  NSVD  Saralyn PilarAlexander Niranjan Rufener, DO [facility], PGY-2 07/12/2014, 9:51 PM

## 2014-07-12 NOTE — Anesthesia Procedure Notes (Signed)
Epidural Patient location during procedure: OB Start time: 07/12/2014 4:46 PM End time: 07/12/2014 4:58 PM  Staffing Anesthesiologist: Heather RobertsSINGER, Coralie Stanke Performed by: anesthesiologist   Preanesthetic Checklist Completed: patient identified, pre-op evaluation, timeout performed, IV checked, risks and benefits discussed and monitors and equipment checked  Epidural Patient position: sitting Prep: ChloraPrep Patient monitoring: blood pressure Approach: midline Location: L3-L4 Injection technique: LOR saline  Needle:  Needle type: Tuohy  Needle gauge: 17 G Needle length: 9 cm Needle insertion depth: 8 cm Catheter size: 19 Gauge Catheter at skin depth: 13 cm Test dose: negative and Other  Assessment Events: blood not aspirated, injection not painful, no injection resistance, negative IV test and no paresthesia  Additional Notes Informed consent obtained prior to proceeding including risk of failure, 1% risk of PDPH, risk of minor discomfort and bruising.  Discussed rare but serious complications including epidural abscess, permanent nerve injury, epidural hematoma.  Discussed alternatives to epidural analgesia and patient desires to proceed.  Timeout performed pre-procedure verifying patient name, procedure, and platelet count.  Patient tolerated procedure well.    SA test negative as confirmed by no motor block of hip abduction at 5 min post injection of 50 mg of lidocaine into the epidural catheter.  Bupivacaine 0.1% with fentanyl 2.155mcg/ml infused post procedure at 3812ml/hr with PCEA of 9ml every 10 mins.

## 2014-07-12 NOTE — Anesthesia Preprocedure Evaluation (Signed)
Anesthesia Evaluation  Patient identified by MRN, date of birth, ID band Patient awake    Reviewed: Allergy & Precautions, H&P , NPO status , Patient's Chart, lab work & pertinent test results  Airway Mallampati: II  TM Distance: >3 FB Neck ROM: full    Dental  (+) Teeth Intact, Dental Advidsory Given   Pulmonary neg pulmonary ROS,  breath sounds clear to auscultation        Cardiovascular hypertension, negative cardio ROS  Rhythm:regular Rate:Normal     Neuro/Psych negative neurological ROS  negative psych ROS   GI/Hepatic negative GI ROS, Neg liver ROS,   Endo/Other  negative endocrine ROSMorbid obesity  Renal/GU negative Renal ROS     Musculoskeletal   Abdominal   Peds  Hematology   Anesthesia Other Findings   Reproductive/Obstetrics (+) Pregnancy                             Anesthesia Physical Anesthesia Plan  ASA: III  Anesthesia Plan: Epidural   Post-op Pain Management:    Induction:   Airway Management Planned:   Additional Equipment:   Intra-op Plan:   Post-operative Plan:   Informed Consent: I have reviewed the patients History and Physical, chart, labs and discussed the procedure including the risks, benefits and alternatives for the proposed anesthesia with the patient or authorized representative who has indicated his/her understanding and acceptance.   Dental Advisory Given  Plan Discussed with: Anesthesiologist  Anesthesia Plan Comments:         Anesthesia Quick Evaluation

## 2014-07-12 NOTE — Progress Notes (Signed)
Labor Progress Note  Jacqlyn KraussVontasia J Dallman is a 24 y.o. G3P2002 at 512w0d  admitted for IOL 2/2 gHTN.  S: Endorses lower abdominal pain 2/2 contractions. HA reported this AM is still present. Denies RUQ or scotoma.  O:  BP 153/84 mmHg  Pulse 87  Temp(Src) 98.5 F (36.9 C) (Oral)  Resp 20  Ht 5\' 10"  (1.778 m)  Wt 114.306 kg (252 lb)  BMI 36.16 kg/m2  SpO2 100%  LMP 11/24/2013 (Approximate)   FHT:  FHR: 135 bpm, variability: moderate,  accelerations:  Present,  decelerations:  Present occasional variable decel UC:   Regular, every 2-3 min SVE:   Dilation: 1 Effacement (%): 50 Station: -2 Exam by:: Wynelle BourgeoisMarie Williams, CNM FB remains in cervix. Membranes intact  Pitocin @ 14 mu/min   Labs: Lab Results  Component Value Date   WBC 10.8* 07/12/2014   HGB 9.1* 07/12/2014   HCT 27.9* 07/12/2014   MCV 90.0 07/12/2014   PLT 283 07/12/2014    Assessment / Plan: 24 y.o. G3P2002 722w0d in early labor. Progressing well on FB and Pitocin.  Induction of labor due to gestational hypertension,  progressing well on pitocin  Labor: Progressing well on pitocin. Will continue to monitor for FB status. Fetal Wellbeing:  Category I Pain Control:  Fentanyl Planning epidural. Anticipated MOD:  NSVD  Expectant management. Planning SVD. Urine drug screen ordered and reminded Pt to void in cup for Proteinuria/Cr labs. BP remains below severe range (IV HTN medication not indicated).   RESIDENT ADDENDUM I saw and examined the patient with medical student. I have discussed the findings and exam with the medical student and agree with the above note. I helped develop the management plan that is described in the student's note, and I agree with the content.  Caryl AdaJazma Teven Mittman, DO 07/12/2014, 3:46 PM PGY-1, [facility]

## 2014-07-12 NOTE — H&P (Signed)
LABOR ADMISSION HISTORY AND PHYSICAL  Whitney Todd is a 24 y.o. female G28P2002 with IUP at [redacted]w[redacted]d by 3rd trimester u/s presenting for IOL 2/2 GHTN. She reports +FMs, No LOF, no VB, no blurry vision, or peripheral edema, and RUQ pain.  Endorses bilateral HA since this morning. She plans on bottlefeeding. She request Nexplanon for birth control. Requests epidural placement.  Of note, Pt had normal pre-eclampsia labs on 06/02/14.  Dating: By 3rd trimester u/s --->  Estimated Date of Delivery: 08/02/14  Sono:  , CWD, normal anatomy, cephalic presentation, 1433 g, 49% EFW, EDD: 08/02/14 Doppler at [redacted]w[redacted]d: No Absent DFV, no reverse DFV, Umbilical artery S/D 3.2 Follow up Sono : EFW 2184g, 49th percentile   Prenatal History/Complications: GTN, maternal substance abuse antepartum (+THC on initial prenatal drug screen on 06/16/14), insufficient and late prenatal care (initiated prenatal care at [redacted]w[redacted]d gestation) following MAU visit, too late for first trimester genetic screen, repeated leukocytes on udip in 3rd trimester, repeat endorsement of HA and scotoma in third trimester   Clinic [facility] Prenatal Labs  Dating 3rd trimester [place] Blood type: A+  Genetic Screen Too late Antibody: neg  Anatomic [place] Normal; poor view of heart rescan at 37 wks Rubella: Imm  GTT  Third trimester: 93 RPR: NR  Flu vaccine 06/02/14 HBsAg: neg  TDaP vaccine 06/02/14  HIV: neg  GBS negative  GBS: negative  Contraception nexplanon WUJ:WJXBJY  Baby Food bottle   Circumcision outpatient   Pediatrician [facility]   Support Person terrance      PGyn: Denies STD's, ovarian cysts. PSurg: Denies abdominal surgery or gynecological surgery.   Past Medical History: Past Medical History  Diagnosis Date  . Pregnancy induced hypertension     not on meds    Past Surgical History: Past Surgical History  Procedure  Laterality Date  . No past surgeries      Obstetrical History: OB History    Gravida Para Term Preterm AB TAB SAB Ectopic Multiple Living   0 0 0 0 0 0 2    1st: IOL [redacted]w[redacted]d 2/2 GHTN, SVD 2nd: IOL [redacted]w[redacted]d 2/2 GHTN, SVD  Social History: History   Social History  . Marital Status: Single    Spouse Name: N/A  . Number of Children: N/A  . Years of Education: N/A   Social History Main Topics  . Smoking status: Never Smoker   . Smokeless tobacco: Never Used  . Alcohol Use: No  . Drug Use: No  . Sexual Activity: No   Other Topics Concern  . None   Social History Narrative    Family History: Family History  Problem Relation Age of Onset  . Hypertension Mother   . Diabetes Father   . Hypertension Father   . Diabetes Paternal Grandmother     Allergies: No Known Allergies  No prescriptions prior to admission     Review of Systems  All systems reviewed and negative except as stated in HPI  Blood pressure 155/98, pulse 79, temperature 98.1 F (36.7 C), resp. rate 20, height  (1.778 m), weight 114.306 kg (252 lb), last menstrual period 11/24/2013, unknown if currently breastfeeding. General appearance: alert, appears stated age and no distress Lungs: clear to auscultation bilaterally Heart: regular rate and rhythm Abdomen: soft, non-tender; bowel sounds normal Pelvic: not indicated. Extremities: Homans sign is negative, no sign of DVT DTR's 2+ Presentation: cephalic Fetal monitoring: Baseline: 135 bmp, Variability: moderate (6-25 bpm), Accelerations: 10x10 and decelerations: None Uterine  activity: irregular, 1 every 2-7 min Dilation: 1 Effacement (%): 50 Station: -2 Exam by:: Wynelle BourgeoisMarie Williams, CNM  Prenatal labs: ABO, Rh: A/POS/-- (04/14 1517) Antibody: NEG (04/14 1517) Rubella:   RPR: NON REAC (04/14 1517)  HBsAg: NEGATIVE (04/14 1517)  HIV: NONREACTIVE (04/14 1517)  GBS: Negative (05/12 0000)  1 hr Glucola: wnl (93) Genetic screening: too  late Anatomy US wnl  Prenatal Transfer Tool  Maternal Diabetes: No Genetic Screening: Too late for 1st trimester genetic screen Maternal Ultrasounds/Referrals: Normal Fetal Ultrasounds or other Referrals:  None Maternal Substance Abuse:  No Significant Maternal Medications:  None Significant Maternal Lab Results: None  Results for orders placed or performed during the hospital encounter of 07/12/14 (from the past 24 hour(s))  CBC   Collection Time: 07/12/14  8:30 AM  Result Value Ref Range   WBC 10.8 (H) 4.0 - 10.5 K/uL   RBC 3.10 (L) 3.87 - 5.11 MIL/uL   Hemoglobin 9.1 (L) 12.0 - 15.0 g/dL   HCT 82.927.9 (L) 56.236.0 - 13.046.0 %   MCV 90.0 78.0 - 100.0 fL   MCH 29.4 26.0 - 34.0 pg   MCHC 32.6 30.0 - 36.0 g/dL   RDW 86.513.0 78.411.5 - 69.615.5 %   Platelets 283 150 - 400 K/uL    Patient Active Problem List   Diagnosis Date Noted  . Gestational hypertension, antepartum 06/23/2014  . 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 Insufficient prenatal care in third trimester 06/02/2014  . Elevated BP 06/02/2014  . Pyuria 05/21/2012    Assessment: Whitney Todd is a 24 y.o. G3P2002 at 8312w0d here for IOL 2/2 GHTN. Doing well. BP not in severe range (<160/110)  #Labor: Start induction with FB. Consider augmentation with Pitocin. Repeat pre-eclampsia labs to r/o pre-eclampsia. #Pain: Planning epidural.  #FWB: Cat I. Doing well. #ID: GBS negative. #MOF: Bottle #MOC: Nexplanon (will schedule at the post-partum visit) #Circ: Outpt circucision.  S/w: social work consult for late prenatal care and maternal substance abuse  Plan: Expectant management. Place FB and consider augmentation with Pitocin. SVD.  Forest BeckerEunice Yim 07/12/2014, 9:25 AM   RESIDENT ADDENDUM I have separately seen and examined the patient. I have discussed the findings and exam with the medical student and agree with the above note. I helped develop the management plan that is described in the student's note, and I agree  with the content. Any changes I have made have been added to above note.   Caryl AdaJazma Phelps, DO 07/12/2014, 3:52 PM PGY-1, [facility]  Seen and examined by me also FHR reactive Rare contractions  Plan induction of labor with Foley and Pitocin Agree with note Aviva SignsMarie L Williams, CNM

## 2014-07-12 NOTE — Progress Notes (Signed)
Patient ID: Whitney KraussVontasia J Hemler, female   DOB: June 17, 1990, 24 y.o.   MRN: [id number] Doing well,sleeping now with epidural relief  Filed Vitals:   07/12/14 1733 07/12/14 1736 07/12/14 1738 07/12/14 1741  BP:  170/94  171/98  Pulse: 97 98 95 97  Temp:      TempSrc:      Resp:  20  18  Height:      Weight:      SpO2: 100%  100%    FHR reactive UCs every  2 min  Cervical exam deferred  Dilation: 1 Effacement (%): 50 Station: -2 Presentation: Vertex Exam by:: Wynelle BourgeoisMarie Williams, CNM  Will give dose of Labetalol for hypertension

## 2014-07-13 ENCOUNTER — Encounter (HOSPITAL_COMMUNITY): Payer: Self-pay

## 2014-07-13 DIAGNOSIS — O0933 Supervision of pregnancy with insufficient antenatal care, third trimester: Secondary | ICD-10-CM

## 2014-07-13 DIAGNOSIS — O99324 Drug use complicating childbirth: Secondary | ICD-10-CM

## 2014-07-13 DIAGNOSIS — O133 Gestational [pregnancy-induced] hypertension without significant proteinuria, third trimester: Secondary | ICD-10-CM

## 2014-07-13 DIAGNOSIS — F1210 Cannabis abuse, uncomplicated: Secondary | ICD-10-CM

## 2014-07-13 DIAGNOSIS — Z3A37 37 weeks gestation of pregnancy: Secondary | ICD-10-CM

## 2014-07-13 LAB — CBC
HEMATOCRIT: 27.3 % — AB (ref 36.0–46.0)
Hemoglobin: 9.1 g/dL — ABNORMAL LOW (ref 12.0–15.0)
MCH: 29.8 pg (ref 26.0–34.0)
MCHC: 33.3 g/dL (ref 30.0–36.0)
MCV: 89.5 fL (ref 78.0–100.0)
Platelets: 288 10*3/uL (ref 150–400)
RBC: 3.05 MIL/uL — ABNORMAL LOW (ref 3.87–5.11)
RDW: 13 % (ref 11.5–15.5)
WBC: 19.5 10*3/uL — AB (ref 4.0–10.5)

## 2014-07-13 MED ORDER — LANOLIN HYDROUS EX OINT: TOPICAL_OINTMENT | CUTANEOUS | Status: DC | PRN

## 2014-07-13 MED ORDER — IBUPROFEN 600 MG PO TABS
600.0000 mg | ORAL_TABLET | Freq: Four times a day (QID) | ORAL | Status: DC
  Administered 2014-07-13 – 2014-07-14 (×6): 600 mg via ORAL
  Filled 2014-07-13 (×6): qty 1

## 2014-07-13 MED ORDER — LACTATED RINGERS IV SOLN
INTRAVENOUS | Status: DC
  Administered 2014-07-13: 01:00:00 via INTRAUTERINE

## 2014-07-13 MED ORDER — WITCH HAZEL-GLYCERIN EX PADS: 1.0000 "application " | MEDICATED_PAD | CUTANEOUS | Status: DC | PRN

## 2014-07-13 MED ORDER — ACETAMINOPHEN 325 MG PO TABS: 650.0000 mg | ORAL_TABLET | ORAL | Status: DC | PRN

## 2014-07-13 MED ORDER — DIPHENHYDRAMINE HCL 25 MG PO CAPS: 25.0000 mg | ORAL_CAPSULE | Freq: Four times a day (QID) | ORAL | Status: DC | PRN

## 2014-07-13 MED ORDER — DIBUCAINE 1 % RE OINT: 1.0000 "application " | TOPICAL_OINTMENT | RECTAL | Status: DC | PRN

## 2014-07-13 MED ORDER — PRENATAL MULTIVITAMIN CH
1.0000 | ORAL_TABLET | Freq: Every day | ORAL | Status: DC
  Administered 2014-07-13 – 2014-07-14 (×2): 1 via ORAL
  Filled 2014-07-13 (×2): qty 1

## 2014-07-13 MED ORDER — SIMETHICONE 80 MG PO CHEW: 80.0000 mg | CHEWABLE_TABLET | ORAL | Status: DC | PRN

## 2014-07-13 MED ORDER — TETANUS-DIPHTH-ACELL PERTUSSIS 5-2.5-18.5 LF-MCG/0.5 IM SUSP: 0.5000 mL | Freq: Once | INTRAMUSCULAR | Status: DC

## 2014-07-13 MED ORDER — ONDANSETRON HCL 4 MG/2ML IJ SOLN: 4.0000 mg | INTRAMUSCULAR | Status: DC | PRN

## 2014-07-13 MED ORDER — ZOLPIDEM TARTRATE 5 MG PO TABS: 5.0000 mg | ORAL_TABLET | Freq: Every evening | ORAL | Status: DC | PRN

## 2014-07-13 MED ORDER — ONDANSETRON HCL 4 MG PO TABS: 4.0000 mg | ORAL_TABLET | ORAL | Status: DC | PRN

## 2014-07-13 MED ORDER — OXYCODONE-ACETAMINOPHEN 5-325 MG PO TABS
1.0000 | ORAL_TABLET | ORAL | Status: DC | PRN
Start: 2014-07-13 — End: 2014-07-14
  Administered 2014-07-14: 1 via ORAL
  Filled 2014-07-13: qty 1

## 2014-07-13 MED ORDER — OXYCODONE-ACETAMINOPHEN 5-325 MG PO TABS: 2.0000 | ORAL_TABLET | ORAL | Status: DC | PRN

## 2014-07-13 MED ORDER — SENNOSIDES-DOCUSATE SODIUM 8.6-50 MG PO TABS
2.0000 | ORAL_TABLET | ORAL | Status: DC
  Filled 2014-07-13: qty 2

## 2014-07-13 MED ORDER — BENZOCAINE-MENTHOL 20-0.5 % EX AERO
1.0000 "application " | INHALATION_SPRAY | CUTANEOUS | Status: DC | PRN
  Administered 2014-07-13: 1 via TOPICAL
  Filled 2014-07-13: qty 56

## 2014-07-13 NOTE — Anesthesia Postprocedure Evaluation (Signed)
  Anesthesia Post-op Note  Patient: Jacqlyn KraussVontasia J Dubey  Procedure(s) Performed: * No procedures listed *  Patient Location: PACU and Mother/Baby  Anesthesia Type:Epidural  Level of Consciousness: awake, alert  and oriented  Airway and Oxygen Therapy: Patient Spontanous Breathing  Post-op Pain: none  Post-op Assessment: Post-op Vital signs reviewed and Patient's Cardiovascular Status Stable  Post-op Vital Signs: Reviewed and stable  Last Vitals:  Filed Vitals:   07/13/14 0920  BP: 154/89  Pulse: 91  Temp: 36.8 C  Resp: 18    Complications: No apparent anesthesia complications

## 2014-07-13 NOTE — Progress Notes (Signed)
Resident MD notified of patients blood pressure of 154/89. No orders received.

## 2014-07-14 MED ORDER — IBUPROFEN 600 MG PO TABS: 600.0000 mg | ORAL_TABLET | Freq: Four times a day (QID) | ORAL | Status: DC

## 2014-07-14 MED ORDER — SENNOSIDES-DOCUSATE SODIUM 8.6-50 MG PO TABS: 2.0000 | ORAL_TABLET | ORAL | Status: DC | PRN

## 2014-07-14 MED ORDER — PRENATAL MULTIVITAMIN CH: 1.0000 | ORAL_TABLET | Freq: Every day | ORAL | Status: DC

## 2014-07-14 NOTE — Discharge Instructions (Signed)

## 2014-07-14 NOTE — Progress Notes (Signed)
CLINICAL SOCIAL WORK MATERNAL/CHILD NOTE  Patient Details  Name: Whitney Todd MRN: [id number] Date of Birth: 07/13/2012  Date:  07/14/2014  Clinical Social Worker Initiating Note:  Chiron Campione, LCSW Date/ Time Initiated:  07/14/14/1030     Child's Name:  Whitney Todd   Legal Guardian:  Francisca Sparr (mother) and Terrance (father)  Need for Interpreter:  None   Date of Referral:  07/13/14     Reason for Referral:  Current Substance Use/Substance Use During Pregnancy , Late or No Prenatal Care    Referral Source:  Central Nursery   Address:  [address] [place], [place] [address]  Phone number:  [phone]   Household Members:  Minor Children (06/12/12 and 01/28/10), Mother, 3 younger siblnigs  Natural Supports (not living in the home):  Spouse/significant other, Extended Family   Professional Supports: None   Employment: Full-time   Type of Work:   Did not identify  Education:    N/A  Financial Resources:  Medicaid   Other Resources:  Food Stamps , WIC   Cultural/Religious Considerations Which May Impact Care:  None reported  Strengths:  Ability to meet basic needs , Home prepared for child , Pediatrician chosen    Risk Factors/Current Problems:   1)Substance Use: MOB presents with THC use during the pregnancy. She had a positive UDS on 4/14 and 4/28. MOB had a negative UDS 5/24.  Infant UDS is negative and MDS is pending. 2) LPNC: MOB initiated care at [redacted]w[redacted]d. MOB reported that she did not learn that she was pregnant until she was 6 months pregnant.  MOB stated no additional barriers to accessing care.  Cognitive State:  Able to Concentrate , Alert , Goal Oriented    Mood/Affect:  Euthymic  .  MOB presented with a limited range in affect, and appeared closed and guarded.  CSW Assessment:  CSW received consult due to LPNC and THC use during pregnancy. MOB presented as closed, guarded, and difficult to engage.  MOB was a limited historian as  her answers were short and concise, and she presented with minimal interest in completing CSW assessment.  MOB provided consent for her mother to remain in the room, and her mother provided majority of content in the assessment.  MOB's mother was interacting and holding the infant during the visit.    MOB reported readiness to be discharged home. She stated that she is tired and has had few opportunities to sleep at the hospital.  MOB shared that she has support at home, and MGM clarified that the MOB lives at home with her, and the MOB's 3 younger siblings.  The MGM shared that there are numerous family members who have offered to provide MOB with additional support as she transitions to the postpartum period.  CSW provided education on perinatal mood disorders. MOB denied mental health history and denied history of perinatal mood and anxiety disorders. MOB agreed to contact her medical providers if she notes symptoms.   CSW inquired about events that led to LPNC.  MOB stated that she did not learn of the pregnancy until she was 6 months pregnant.  She denied any access to care, and reported that she will be able to access care in the postpartum period.  CSW inquired about MOB"s feelings when she learned of the pregnancy and had 3 months to emotionally and physically prepare for the infant.  She stated that she was "fine", but the MGM shared that she was nervous and scared to tell   anyone since she had recently had difficulties with her car, had started a new job, and had "a lot going on".  CSW normalized and validated the accumulative impact of these stressors, but MOB continued to not be forthcoming with information.  MGM confirmed that all basic items have been secured for the infant, and the MOB denied needing additional access to resources or supports to help her as she transitions home.   CSW also inquired about THC use during pregnancy. MOB was a vague historian, and did not clarify use.  She stated  that she never smoked THC herself, but shared that she was "around it".  CSW provided education on the hospital drug screen policy, and shared that infant's UDS is negative.  CSW discussed pending MDS, and MOB did not indicate any beliefs about the potential outcome of the MDS.  MOB verbalized understanding that CPS will be contacted if the MDS is positive.  MOB denied questions, concerns, or prior CPS involvement. CSW provided information on what to anticipate with a CPS report for THC.    No additional questions, concerns, or needs from MOB or MGM.  They agreed to contact CSW if needs arise.  CSW Plan/Description:   1)Patient/Family Education: Perinatal mood and anxiety disorders, hospital drug screen policy 2) CSW to monitor infant's MDS and will notify CPS if it is positive for substances. 3)No Further Intervention Required/No Barriers to Discharge    Mikaia Janvier N, LCSW 07/14/2014, 11:08 AM  

## 2014-07-14 NOTE — Progress Notes (Cosign Needed)
Post Partum Day 1 Subjective:  Jacqlyn KraussVontasia J Parmar is a 24 y.o. [id number] 5970w1d s/p SVD.  No acute events overnight.  Pt denies problems with ambulating, voiding or po intake.  She denies nausea or vomiting.  Pain is well controlled with Motrin.  She has had flatus. She has had bowel movement.  Lochia Moderate.  Plan for birth control is Nexplanon.  Method of Feeding: Bottle.  Objective: Blood pressure 147/78, pulse 73, temperature 99 F (37.2 C), temperature source Oral, resp. rate 18, height 5\' 10"  (1.778 m), weight 114.306 kg (252 lb), last menstrual period 11/24/2013, SpO2 100 %, unknown if currently breastfeeding.  Physical Exam:  General: alert, cooperative and no distress. Lochia: normal flow. Chest: CTAB. Heart: RRR no m/r/g. Abdomen: +BS, soft, and nontender. Uterine Fundus: firm, palpable 1 finger below the umbilicus. DVT Evaluation: No evidence of DVT seen on physical exam. Extremities: No edema   Recent Labs  07/12/14 1619 07/13/14 0740  HGB 9.7* 9.1*  HCT 29.6* 27.3*    Assessment/Plan:  ASSESSMENT: Jacqlyn KraussVontasia J Silberstein is a 24 y.o. [phone] with a pregnancy complicated by preeclampisa who is now s/p SVD (2370w1d). Her BPs have been dropping since the delivery and she is recovering well.  PLAN: - Social work consult for [employer] and late prenatal care - Discharge possible later today   LOS: 2 days   Fleeta EmmerMichael Danuta Huseman 07/14/2014, 9:39 AM

## 2014-07-14 NOTE — Discharge Summary (Signed)
Obstetric Discharge Summary Reason for Admission: induction of labor Prenatal Procedures: NST and ultrasound Intrapartum Procedures: spontaneous vaginal delivery Postpartum Procedures: none Complications-Operative and Postpartum: none  At 1:31 AM a viable female was delivered via Vaginal, Spontaneous Delivery (Presentation: Left Occiput Anterior). APGAR: 9, 9; weight (pending).  Placenta status: Intact, Spontaneous. Cord: 3 vessels with the following complications: None.  Anesthesia: Epidural  Episiotomy: None Lacerations: Small perineal abrasion Suture Repair: none Est. Blood Loss (mL): 205cc  Upon arrival patient was complete and ready to push. She pushed with good maternal effort with 3-4 contractions with good progress to deliver a healthy baby boy without difficulty. Baby had good tone and placed on maternal abdomen for oral suctioning, drying and stimulation. Delayed cord clamping performed and cut by Family member. Placenta delivered intact with 3V cord. Vaginal canal and perineum was inspected and intact with small perineal abrasion only (not requiring sutures). Pitocin was started and uterus massaged until bleeding slowed. Counts of sharps, instruments, and lap pads were all correct.  Hospital Course:  Active Problems:   Hypertension complicating pregnancy   Whitney KraussVontasia J Mckiddy is a 24 y.o. [id number] s/p SVD.  Patient was admitted for IOL for preeclampsia.  She has postpartum course that was uncomplicated including no problems with ambulating, PO intake, urination, pain, or bleeding. The pt feels ready to go home and will be discharged with outpatient follow-up.  Today: No acute events overnight.  Pt denies problems with ambulating, voiding or po intake.  She denies nausea or vomiting.  Pain is well controlled.  She has had flatus. She has had bowel movement.  Lochia Moderate.  Plan for birth control is Nexplanon.  Method of Feeding: Bottle  Physical Exam:  BP 147/78 mmHg   Pulse 73  Temp(Src) 99 F (37.2 C) (Oral)  Resp 18  Ht 5\' 10"  (1.778 m)  Wt 252 lb (114.306 kg)  BMI 36.16 kg/m2  SpO2 100%  LMP 11/24/2013 (Approximate)  Breastfeeding: No.  General: alert, cooperative and no distress. Lochia: normal flow. Chest: CTAB. Heart: RRR no m/r/g. Abdomen: +BS, soft, and nontender. Uterine Fundus: firm, palpable 1 finger below the umbilicus. DVT Evaluation: No evidence of DVT seen on physical exam. Extremities: No edema  H/H: Lab Results  Component Value Date/Time   HGB 9.1* 07/13/2014 07:40 AM   HCT 27.3* 07/13/2014 07:40 AM    Discharge Diagnoses: Term Pregnancy-delivered and Preelampsia  Discharge Information: Date: 07/14/2014 Activity: pelvic rest Diet: routine  Medications: PNV, Ibuprofen and Colace Breast feeding:  No: Bottle. Condition: stable Instructions: refer to handout Discharge to: home      Medication List    Notice    You have not been prescribed any medications.         Follow-up Information    Schedule an appointment as soon as possible [facility]   Why:  Postpartum visit   Contact information:   [address] [place] [phone]      Fleeta EmmerMichael Cunningham, MS3 07/14/2014,12:07 PM  CNM attempted to round on pt twice, but she was in bathroom first time and had left the unit the second time. Labs and VS reviewed. Will send Rx iron for mild anemia.   [place]ia Mccall Lomax, CNM 07/14/2014 3:05 PM

## 2014-07-17 DIAGNOSIS — O133 Gestational [pregnancy-induced] hypertension without significant proteinuria, third trimester: Secondary | ICD-10-CM

## 2014-07-17 DIAGNOSIS — O09893 Supervision of other high risk pregnancies, third trimester: Secondary | ICD-10-CM

## 2014-08-03 ENCOUNTER — Telehealth: Payer: Self-pay | Admitting: *Deleted

## 2014-08-03 MED ORDER — HYDROCHLOROTHIAZIDE 25 MG PO TABS: 25.0000 mg | ORAL_TABLET | Freq: Every day | ORAL | Status: DC

## 2014-08-03 NOTE — Telephone Encounter (Signed)
Message left by Cy Blamer, RN from [employer] on nurse voice mail after clinic closed for the day. She stated that she had completed 2 home visits for this pt who delivered on 07/13/14.  First visit was last week and BP was 144/96. At that time pt reported occasional H/A's and denied visual changes. She went back for second visit on 6/14 and BP was 146/96. Pt denies H/A or visual changes. Beverely Low stated that she wanted to make [place] aware of pt status.  I consulted with Dr. Jolayne Panther and received Rx for HCTZ. She also requested that the [employer] RN see pt next week for BP check or if she is not able to do so, pt needs nurse appt in our office for BP during week of 6/20. I left a voice mail message for [facility] RN to see pt next week for BP check or notify our office if she is unable to do so. I then called pt and informed her of Rx sent to her pharmacy - purpose of medication and dosage instructions reviewed. Pt was advised that she may take Tylenol for a mild H/A. She should go to MAU if she develops a severe H/A, dizziness or visual changes. Pt reminded of scheduled post partum appt on 6/30 @ 1245. Pt voiced understanding of all information and instructions given.

## 2014-08-11 ENCOUNTER — Telehealth: Payer: Self-pay | Admitting: *Deleted

## 2014-08-11 NOTE — Telephone Encounter (Signed)
[employer] nurse called stating she  Called [place] last week about an elevated blood pressure at a home visit.  States she got a call that we called in  A blood pressure medicine and wanted her to do a bp check this week. She states she has had a hard time catching up to patient. States patient wasn't there for a bp check at a visit she scheduled with her. States she has left messages. Beverely Low is off tomorrow.     Called Mattia and we discussed her needing a blood pressure check. She states the home nurse can come out Monday for a blood pressure check. I also reviewed with her the postpartum appointment she has scheduled for [place] next week on 08/18/14.  She voices understanding. I called Jeannie and left a message I reached Anarie and she agreed to Monday appointment- asked her to call Feliciana to schedule.  I also stated I reviewed her appointment with [place] 08/18/14 for postpartum.

## 2014-08-18 ENCOUNTER — Ambulatory Visit: Payer: Self-pay | Admitting: Obstetrics & Gynecology

## 2016-02-19 NOTE — L&D Delivery Note (Signed)
Patient arrived after delivery of infant. According to witnesses and EMS baby was born at [phone]. Witness reports that baby hit the floor and cord snapped at the time of delivery. Patient reports that she did not know she was pregnant, and she has not had any prenatal care. Placenta delivered complete and intact on EMS, and patient arrived with placenta on the stretcher. Perineum intact.  EBL: 350cc Anesthesia: none Repair: none  Baby to NICU  Tawnya CrookHogan, Heather Donovan  7:29 AM 03/14/16

## 2016-03-14 ENCOUNTER — Encounter (HOSPITAL_COMMUNITY): Payer: Self-pay

## 2016-03-14 ENCOUNTER — Inpatient Hospital Stay (HOSPITAL_COMMUNITY)
Admission: AD | Admit: 2016-03-14 | Discharge: 2016-03-16 | DRG: 776 | Disposition: A | Discharge status: Home / Self Care | Payer: Medicaid Other | Source: Ambulatory Visit | Attending: Obstetrics & Gynecology | Admitting: Obstetrics & Gynecology

## 2016-03-14 DIAGNOSIS — O1495 Unspecified pre-eclampsia, complicating the puerperium: Secondary | ICD-10-CM

## 2016-03-14 DIAGNOSIS — O139 Gestational [pregnancy-induced] hypertension without significant proteinuria, unspecified trimester: Secondary | ICD-10-CM | POA: Diagnosis present

## 2016-03-14 DIAGNOSIS — Z3A00 Weeks of gestation of pregnancy not specified: Secondary | ICD-10-CM

## 2016-03-14 DIAGNOSIS — O1415 Severe pre-eclampsia, complicating the puerperium: Principal | ICD-10-CM | POA: Diagnosis present

## 2016-03-14 DIAGNOSIS — O1414 Severe pre-eclampsia complicating childbirth: Secondary | ICD-10-CM

## 2016-03-14 LAB — URINALYSIS, ROUTINE W REFLEX MICROSCOPIC
BACTERIA UA: NONE SEEN
BILIRUBIN URINE: NEGATIVE
Glucose, UA: NEGATIVE mg/dL
HGB URINE DIPSTICK: NEGATIVE
KETONES UR: 20 mg/dL — AB
NITRITE: NEGATIVE
PROTEIN: 30 mg/dL — AB
SPECIFIC GRAVITY, URINE: 1.018 (ref 1.005–1.030)
pH: 7 (ref 5.0–8.0)

## 2016-03-14 LAB — RAPID URINE DRUG SCREEN, HOSP PERFORMED
Amphetamines: NOT DETECTED
Barbiturates: NOT DETECTED
Benzodiazepines: NOT DETECTED
Cocaine: NOT DETECTED
OPIATES: NOT DETECTED
Tetrahydrocannabinol: POSITIVE — AB

## 2016-03-14 LAB — COMPREHENSIVE METABOLIC PANEL
ALT: 14 U/L (ref 14–54)
AST: 18 U/L (ref 15–41)
Albumin: 3.2 g/dL — ABNORMAL LOW (ref 3.5–5.0)
Alkaline Phosphatase: 142 U/L — ABNORMAL HIGH (ref 38–126)
Anion gap: 10 (ref 5–15)
BUN: 8 mg/dL (ref 6–20)
CO2: 22 mmol/L (ref 22–32)
CREATININE: 0.6 mg/dL (ref 0.44–1.00)
Calcium: 8.9 mg/dL (ref 8.9–10.3)
Chloride: 103 mmol/L (ref 101–111)
Glucose, Bld: 115 mg/dL — ABNORMAL HIGH (ref 65–99)
Potassium: 3.3 mmol/L — ABNORMAL LOW (ref 3.5–5.1)
Sodium: 135 mmol/L (ref 135–145)
TOTAL PROTEIN: 8.4 g/dL — AB (ref 6.5–8.1)
Total Bilirubin: 0.4 mg/dL (ref 0.3–1.2)

## 2016-03-14 LAB — PROTEIN / CREATININE RATIO, URINE
Creatinine, Urine: 123 mg/dL
Protein Creatinine Ratio: 0.24 mg/mg{Cre} — ABNORMAL HIGH (ref 0.00–0.15)
TOTAL PROTEIN, URINE: 29 mg/dL

## 2016-03-14 LAB — HIV ANTIBODY (ROUTINE TESTING W REFLEX): HIV Screen 4th Generation wRfx: NONREACTIVE

## 2016-03-14 LAB — CBC
HEMATOCRIT: 31.2 % — AB (ref 36.0–46.0)
Hemoglobin: 10.4 g/dL — ABNORMAL LOW (ref 12.0–15.0)
MCH: 30 pg (ref 26.0–34.0)
MCHC: 33.3 g/dL (ref 30.0–36.0)
MCV: 89.9 fL (ref 78.0–100.0)
PLATELETS: 288 10*3/uL (ref 150–400)
RBC: 3.47 MIL/uL — AB (ref 3.87–5.11)
RDW: 13.8 % (ref 11.5–15.5)
WBC: 15.9 10*3/uL — AB (ref 4.0–10.5)

## 2016-03-14 LAB — RPR: RPR: NONREACTIVE

## 2016-03-14 LAB — HEPATITIS B SURFACE ANTIGEN: HEP B S AG: NEGATIVE

## 2016-03-14 LAB — ABO/RH: ABO/RH(D): A POS

## 2016-03-14 MED ORDER — TETANUS-DIPHTH-ACELL PERTUSSIS 5-2.5-18.5 LF-MCG/0.5 IM SUSP: 0.5000 mL | Freq: Once | INTRAMUSCULAR | Status: DC

## 2016-03-14 MED ORDER — BENZOCAINE-MENTHOL 20-0.5 % EX AERO
1.0000 "application " | INHALATION_SPRAY | CUTANEOUS | Status: DC | PRN
  Filled 2016-03-14: qty 56

## 2016-03-14 MED ORDER — WITCH HAZEL-GLYCERIN EX PADS: 1.0000 "application " | MEDICATED_PAD | CUTANEOUS | Status: DC | PRN

## 2016-03-14 MED ORDER — DIPHENHYDRAMINE HCL 25 MG PO CAPS: 25.0000 mg | ORAL_CAPSULE | Freq: Four times a day (QID) | ORAL | Status: DC | PRN

## 2016-03-14 MED ORDER — ONDANSETRON HCL 4 MG PO TABS: 4.0000 mg | ORAL_TABLET | ORAL | Status: DC | PRN

## 2016-03-14 MED ORDER — LACTATED RINGERS IV SOLN
INTRAVENOUS | Status: DC
  Administered 2016-03-14 – 2016-03-15 (×3): via INTRAVENOUS

## 2016-03-14 MED ORDER — IBUPROFEN 800 MG PO TABS
800.0000 mg | ORAL_TABLET | Freq: Once | ORAL | Status: AC
  Administered 2016-03-14: 800 mg via ORAL
  Filled 2016-03-14: qty 1

## 2016-03-14 MED ORDER — LACTATED RINGERS IV SOLN
2.0000 g/h | INTRAVENOUS | Status: AC
  Administered 2016-03-14 – 2016-03-15 (×2): 2 g/h via INTRAVENOUS
  Filled 2016-03-14 (×2): qty 80

## 2016-03-14 MED ORDER — SIMETHICONE 80 MG PO CHEW
80.0000 mg | CHEWABLE_TABLET | ORAL | Status: DC | PRN
  Filled 2016-03-14: qty 1

## 2016-03-14 MED ORDER — DIBUCAINE 1 % RE OINT
1.0000 "application " | TOPICAL_OINTMENT | RECTAL | Status: DC | PRN
  Filled 2016-03-14: qty 28

## 2016-03-14 MED ORDER — ZOLPIDEM TARTRATE 5 MG PO TABS: 5.0000 mg | ORAL_TABLET | Freq: Every evening | ORAL | Status: DC | PRN

## 2016-03-14 MED ORDER — ONDANSETRON HCL 4 MG/2ML IJ SOLN: 4.0000 mg | INTRAMUSCULAR | Status: DC | PRN

## 2016-03-14 MED ORDER — PRENATAL MULTIVITAMIN CH
1.0000 | ORAL_TABLET | Freq: Every day | ORAL | Status: DC
  Administered 2016-03-14 – 2016-03-15 (×2): 1 via ORAL
  Filled 2016-03-14 (×3): qty 1

## 2016-03-14 MED ORDER — COCONUT OIL OIL
1.0000 "application " | TOPICAL_OIL | Status: DC | PRN
  Filled 2016-03-14: qty 120

## 2016-03-14 MED ORDER — IBUPROFEN 600 MG PO TABS
600.0000 mg | ORAL_TABLET | Freq: Four times a day (QID) | ORAL | Status: DC
  Administered 2016-03-14 – 2016-03-16 (×8): 600 mg via ORAL
  Filled 2016-03-14 (×8): qty 1

## 2016-03-14 MED ORDER — OXYCODONE HCL 5 MG PO TABS
5.0000 mg | ORAL_TABLET | ORAL | Status: DC | PRN
  Administered 2016-03-16 (×2): 5 mg via ORAL
  Filled 2016-03-14 (×2): qty 1

## 2016-03-14 MED ORDER — SENNOSIDES-DOCUSATE SODIUM 8.6-50 MG PO TABS
2.0000 | ORAL_TABLET | ORAL | Status: DC
  Administered 2016-03-14 – 2016-03-16 (×2): 2 via ORAL
  Filled 2016-03-14 (×2): qty 2

## 2016-03-14 MED ORDER — ACETAMINOPHEN 325 MG PO TABS: 650.0000 mg | ORAL_TABLET | ORAL | Status: DC | PRN

## 2016-03-14 MED ORDER — OXYCODONE HCL 5 MG PO TABS
10.0000 mg | ORAL_TABLET | ORAL | Status: DC | PRN
  Administered 2016-03-14 – 2016-03-15 (×4): 10 mg via ORAL
  Filled 2016-03-14 (×4): qty 2

## 2016-03-14 NOTE — MAU Note (Signed)
Patient presented via EMS. Patient had a delivery at home. Patient did not know she was pregnant and has had no prenatal care.

## 2016-03-14 NOTE — Progress Notes (Signed)
CSW acknowledges consult for home delivery, NPNC/unaware of pregnancy, NICU admission, marijuana use, and unsure if MOB wishes to parent child.  CSW spoke with MOB's RN to request that [facility] support be offered to [facility] as CSW typically waits until the day after delivery to complete assessment.  CSW is happy to meet with MOB today if she is desiring to make an adoption plan or if she would like to speak with CSW for any other reason.  Bedside RN informed CSW that MOB initially stated that she did not want contact with the infant, but has recently requested to go to the NICU to visit.  RN will call CSW if MOB would like to speak with CSW today.

## 2016-03-14 NOTE — H&P (Signed)
Whitney Todd is a 26 y.o. female presenting for NSVD of live infant at home at [phone]. Patient states that she awoke this morning, and felt like she had to use the bathroom. She ended up delivering an infant. She reports that she did not know she was pregnant, and she has not had any prenatal care for this pregnancy. She was last seen at the discharge of her last baby on 07/14/14. She did not return for a postpartum visit. Chart review shows that patient had a hx of substance abuse. In addition she had elevated blood pressure with her last pregnancy. She had labetalol, but no magnesium. EMS reports that B/P at home 180/101, 130/100. She denies any HA, visual disturbances or RUQ pain at this time.   Placenta delivered at arrival.  OB History    Gravida Para Term Preterm AB Living   3 3 3  0 0 3   SAB TAB Ectopic Multiple Live Births   0 0 0 0 3     Past Medical History:  Diagnosis Date  . Pregnancy induced hypertension    not on meds   Past Surgical History:  Procedure Laterality Date  . NO PAST SURGERIES     Family History: family history includes Diabetes in her father and paternal grandmother; Hypertension in her father and mother. Social History:  reports that she has never smoked. She has never used smokeless tobacco. She reports that she does not drink alcohol or use drugs.     Maternal Diabetes: No Genetic Screening: Declined Maternal Ultrasounds/Referrals: Declined Fetal Ultrasounds or other Referrals:  None Maternal Substance Abuse:  Yes:  Type: Other:  Significant Maternal Medications:  None Significant Maternal Lab Results:  None Other Comments:  No prenatal care.   ROS Maternal Medical History:  Reason for admission: NSVD of live infant at home.   Prenatal complications: Substance abuse.   No prenatal care.       Blood pressure (!) 130/103, pulse 77, temperature 98.6 F (37 C), temperature source Oral, resp. rate 18, unknown if currently breastfeeding.     Temp:  [98.6 F (37 C)] 98.6 F (37 C) (01/25 0700) Pulse Rate:  [68-78] 73 (01/25 0801) Resp:  [18] 18 (01/25 0801) BP: (130-145)/(73-103) 144/73 (01/25 0801)   Exam Physical Exam  Nursing note and vitals reviewed. Constitutional: She is oriented to person, place, and time. She appears well-developed and well-nourished. No distress.  Cardiovascular: Normal rate.   Respiratory: Effort normal.  GI: Soft. There is no tenderness. There is no rebound.  Genitourinary:  Genitourinary Comments: Fundus firm at umbilicus One baseball size clot expressed with fundal massage, otherwise bleeding WNL Perineum intact.   Neurological: She is alert and oriented to person, place, and time.  Skin: Skin is warm and dry.  Psychiatric: She has a normal mood and affect.  Patient with flat affect When shown the baby, the patient started to cry.     Prenatal labs: ABO, Rh:  A pos  Antibody:  PEND Rubella:   RPR:   PEND HBsAg:  PEND  HIV:   PEND  GBS:   Unknown   Results for orders placed or performed during the hospital encounter of 03/14/16 (from the past 24 hour(s))  CBC     Status: Abnormal   Collection Time: 03/14/16  7:11 AM  Result Value Ref Range   WBC 15.9 (H) 4.0 - 10.5 K/uL   RBC 3.47 (L) 3.87 - 5.11 MIL/uL   Hemoglobin 10.4 (L) 12.0 -  15.0 g/dL   HCT 91.431.2 (L) 78.236.0 - 95.646.0 %   MCV 89.9 78.0 - 100.0 fL   MCH 30.0 26.0 - 34.0 pg   MCHC 33.3 30.0 - 36.0 g/dL   RDW 21.313.8 08.611.5 - 57.815.5 %   Platelets 288 150 - 400 K/uL  Comprehensive metabolic panel     Status: Abnormal   Collection Time: 03/14/16  7:11 AM  Result Value Ref Range   Sodium 135 135 - 145 mmol/L   Potassium 3.3 (L) 3.5 - 5.1 mmol/L   Chloride 103 101 - 111 mmol/L   CO2 22 22 - 32 mmol/L   Glucose, Bld 115 (H) 65 - 99 mg/dL   BUN 8 6 - 20 mg/dL   Creatinine, Ser 4.690.60 0.44 - 1.00 mg/dL   Calcium 8.9 8.9 - 62.910.3 mg/dL   Total Protein 8.4 (H) 6.5 - 8.1 g/dL   Albumin 3.2 (L) 3.5 - 5.0 g/dL   AST 18 15 - 41 U/L    ALT 14 14 - 54 U/L   Alkaline Phosphatase 142 (H) 38 - 126 U/L   Total Bilirubin 0.4 0.3 - 1.2 mg/dL   GFR calc non Af Amer >60 >60 mL/min   GFR calc Af Amer >60 >60 mL/min   Anion gap 10 5 - 15  ABO/Rh     Status: None   Collection Time: 03/14/16  7:11 AM  Result Value Ref Range   ABO/RH(D) A POS   Urinalysis, Routine w reflex microscopic     Status: Abnormal   Collection Time: 03/14/16  7:25 AM  Result Value Ref Range   Color, Urine YELLOW YELLOW   APPearance HAZY (A) CLEAR   Specific Gravity, Urine 1.018 1.005 - 1.030   pH 7.0 5.0 - 8.0   Glucose, UA NEGATIVE NEGATIVE mg/dL   Hgb urine dipstick NEGATIVE NEGATIVE   Bilirubin Urine NEGATIVE NEGATIVE   Ketones, ur 20 (A) NEGATIVE mg/dL   Protein, ur 30 (A) NEGATIVE mg/dL   Nitrite NEGATIVE NEGATIVE   Leukocytes, UA SMALL (A) NEGATIVE   RBC / HPF 6-30 0 - 5 RBC/hpf   WBC, UA 6-30 0 - 5 WBC/hpf   Bacteria, UA NONE SEEN NONE SEEN   Squamous Epithelial / LPF 0-5 (A) NONE SEEN   Mucous PRESENT   Urine rapid drug screen (hosp performed)     Status: Abnormal   Collection Time: 03/14/16  7:25 AM  Result Value Ref Range   Opiates NONE DETECTED NONE DETECTED   Cocaine NONE DETECTED NONE DETECTED   Benzodiazepines NONE DETECTED NONE DETECTED   Amphetamines NONE DETECTED NONE DETECTED   Tetrahydrocannabinol POSITIVE (A) NONE DETECTED   Barbiturates NONE DETECTED NONE DETECTED  Protein / creatinine ratio, urine     Status: Abnormal   Collection Time: 03/14/16  7:25 AM  Result Value Ref Range   Creatinine, Urine 123.00 mg/dL   Total Protein, Urine 29 mg/dL   Protein Creatinine Ratio 0.24 (H) 0.00 - 0.15 mg/mg[Cre]    Assessment/Plan: NSVD of live infant at home Elevated blood pressure- labs pending  Admit to postpartum Consider Magnesium if B/P remains elevated or pending lab results.   Tawnya CrookHogan, Heather Donovan 03/14/2016, 7:02 AM      Care assumed from Thressa ShellerHeather Hogan CNM at 0800. [facility] labs wnl BPs remain elevated with last  2 in severe range.  S/w Dr. Alysia PennaErvin regarding BPs & results. Will give 24 hours of mag sulfate (2gm/hr)  Judeth HornErin Marshel Golubski, NP

## 2016-03-15 DIAGNOSIS — O1414 Severe pre-eclampsia complicating childbirth: Secondary | ICD-10-CM

## 2016-03-15 DIAGNOSIS — Z3A00 Weeks of gestation of pregnancy not specified: Secondary | ICD-10-CM

## 2016-03-15 LAB — RUBELLA SCREEN: Rubella: 1.72 index (ref >0.99)

## 2016-03-15 NOTE — Clinical Social Work Maternal (Addendum)
CLINICAL SOCIAL WORK MATERNAL/CHILD NOTE  Patient Details  Name: Whitney Todd MRN: [id number] Date of Birth: 03/31/1990  Date:  03/15/2016  Clinical Social Worker Initiating Note:  Tianne Plott, LCSW Date/ Time Initiated:  03/15/16/1530     Child's Name:  Whitney Todd   Legal Guardian:  Mother (Whitney Todd)   Need for Interpreter:  None   Date of Referral:  03/14/16     Reason for Referral:  Adoption , Late or No Prenatal Care , Current Substance Use/Substance Use During Pregnancy    Referral Source:  Central Nursery   Address:  [address] [place], [place] [address]  Phone number:  [phone]   Household Members:  Minor Children (MOB has three children at home: Aaryanna Bingham (6), Aalyanah Bingham (3), Ty'Quarius Dickenson (1))   Natural Supports (not living in the home):  Spouse/significant other, Parent, Immediate Family, Extended Family, Friends (MOB reports that she has a good support system.)   Professional Supports: None   Employment:     Type of Work:     Education:      Financial Resources:   (Not on file)   Other Resources:      Cultural/Religious Considerations Which May Impact Care: None stated.  Strengths:  Ability to meet basic needs , Compliance with medical plan , Pediatrician chosen , Understanding of illness (Pediatric follow up will be at [facility].  MOB does not have all necessary supplies at this time, but states she has the means to get them once she leaves the hospital.  She reports that friends and family are willing to help.)   Risk Factors/Current Problems:  Other (Comment) (Marijuana use.  Lack of preparation for fourth baby.)   Cognitive State:  Able to Concentrate , Alert , Insightful , Linear Thinking    Mood/Affect:  Tearful , Interested , Calm    CSW Assessment: CSW met with MOB in her third floor room/322 to offer support, introduce services, and complete assessment for NPNC, delivery at home, marijuana  use (positive UDS on admission), and admission to NICU (estimated at [redacted] weeks gestation).  MOB was on the phone, but motioned for CSW to come in and ended the conversation in order to talk with CSW.  CSW found MOB to be pleasant and easy to engage. MOB's first question was how long baby would be in the NICU.  CSW explained that this can't be determined at this time, but that there are milestones a baby must accomplish before being able to discharge.  CSW discussed what to expect from a NICU admission at 35 weeks in general terms.  MOB seemed appreciative and understanding of information. CSW explained to MOB that it is documented that she was unaware that she was pregnant and unsure if she wanted contact with baby initially.  CSW offered to talk with MOB openly about what plans she would like to make for baby.  CSW asked MOB how she is feeling about taking baby home.  MOB reports that she was in shock initially and not prepared to have a fourth baby at age 26.  She states her family has been supportive and told her that they will be here to help her in order to take her baby home.  MOB denies feeling like she is being forced into parenting this baby.  She reports friends and family have already begun to get items prepared for baby at home and there were numerous gift bags in MOB's hospital room.  She states   she thought baby would be able to go home with her tomorrow, but states it is actually better that she will need to stay so that MOB can get everything she needs for baby prior to her discharge.  She reports having the means to get all necessary supplies.   MOB reports that FOB/Kenneth is aware that she had a baby yesterday and that she is unsure if he will be involved.  She does not seem concerned about whether or not he will be involved.  She reports that she and the father of her first two daughters/Aaron Bingham have gotten back together and that he has been supportive.  MOB plans to stay at her mother's  home for the first 6 weeks for support.   CSW informed MOB of her positive UDS for marijuana on admission and explained hospital drug screen policy.  MOB stated understanding and reports that she smoked occasionally to "help me eat."  She does not think she has a problem with marijuana use and estimates that her last use was about a month ago.  She denies hx of CPS involvement with her other children. CSW provided education regarding PMADs and SIDS precautions.  MOB was attentive to information being given.  She states awareness of both and no hx of mental illness or PMADs after her other deliveries.  MOB was tearful at times during the conversation and states she still feels like she is in shock.   CSW explained ongoing support services offered by NICU CSW and asked her to call any time she feels she would like to process her feelings.  MOB seemed appreciative and thanked CSW for the visit.    CSW Plan/Description:  Child Protective Service Report , Patient/Family Education , Psychosocial Support and Ongoing Assessment of Needs    Greydon Betke Elizabeth, LCSW 03/15/2016, 9:25 PM  

## 2016-03-15 NOTE — Progress Notes (Signed)
Post Partum Day 1 Subjective: Pt without complaints this morning. Pain controlled. Tolerating diet. Breast pumping. Denies HA  Objective: Blood pressure (!) 123/100, pulse 86, temperature 97.8 F (36.6 C), temperature source Oral, resp. rate 18, height 5\' 10"  (1.778 m), weight 220 lb (99.8 kg), SpO2 100 %, unknown if currently breastfeeding.  Physical Exam: Lungs clear Heart RRR Abd soft + BS U-2 firm nl lochia Ext non tender     Recent Labs  03/14/16 0711  HGB 10.4*  HCT 31.2*    Assessment/Plan: PPD # 1 SVD home delivery Severe PEC No prenatal care  Stable. Will complete 24 hrs magnesium this morning. Monitor BP afterwards. Continue with supportive care   LOS: 1 day   Hermina StaggersMichael L Maegan Buller 03/15/2016, 7:52 AM

## 2016-03-15 NOTE — Progress Notes (Signed)
CSW attempted to meet with MOB to offer support and complete assessment, but she had 3 female visitors with her and requested that CSW return at 3:30pm.  CSW will attempt again to meet with MOB at that time.

## 2016-03-15 NOTE — Progress Notes (Signed)
Pt to NICU to visit infant

## 2016-03-16 DIAGNOSIS — Z3A00 Weeks of gestation of pregnancy not specified: Secondary | ICD-10-CM

## 2016-03-16 DIAGNOSIS — O1414 Severe pre-eclampsia complicating childbirth: Secondary | ICD-10-CM

## 2016-03-16 MED ORDER — AMLODIPINE BESYLATE 10 MG PO TABS
10.0000 mg | ORAL_TABLET | Freq: Every day | ORAL | Status: DC
Start: 2016-03-16 — End: 2016-03-16
  Administered 2016-03-16: 10 mg via ORAL
  Filled 2016-03-16: qty 1

## 2016-03-16 MED ORDER — IBUPROFEN 600 MG PO TABS: 600.0000 mg | ORAL_TABLET | Freq: Four times a day (QID) | ORAL | 0 refills | Status: DC

## 2016-03-16 MED ORDER — AMLODIPINE BESYLATE 10 MG PO TABS: 10.0000 mg | ORAL_TABLET | Freq: Every day | ORAL | 1 refills | Status: DC

## 2016-03-16 NOTE — Discharge Instructions (Signed)
Vaginal Delivery, Care After °Refer to this sheet in the next few weeks. These instructions provide you with information about caring for yourself after vaginal delivery. Your health care provider may also give you more specific instructions. Your treatment has been planned according to current medical practices, but problems sometimes occur. Call your health care provider if you have any problems or questions. °What can I expect after the procedure? °After vaginal delivery, it is common to have: °· Some bleeding from your vagina. °· Soreness in your abdomen, your vagina, and the area of skin between your vaginal opening and your anus (perineum). °· Pelvic cramps. °· Fatigue. °Follow these instructions at home: °Medicines  °· Take over-the-counter and prescription medicines only as told by your health care provider. °· If you were prescribed an antibiotic medicine, take it as told by your health care provider. Do not stop taking the antibiotic until it is finished. °Driving  ° °· Do not drive or operate heavy machinery while taking prescription pain medicine. °· Do not drive for 24 hours if you received a sedative. °Lifestyle  °· Do not drink alcohol. This is especially important if you are breastfeeding or taking medicine to relieve pain. °· Do not use tobacco products, including cigarettes, chewing tobacco, or e-cigarettes. If you need help quitting, ask your health care provider. °Eating and drinking  °· Drink at least 8 eight-ounce glasses of water every day unless you are told not to by your health care provider. If you choose to breastfeed your baby, you may need to drink more water than this. °· Eat high-fiber foods every day. These foods may help prevent or relieve constipation. High-fiber foods include: °¨ Whole grain cereals and breads. °¨ Brown rice. °¨ Beans. °¨ Fresh fruits and vegetables. °Activity  °· Return to your normal activities as told by your health care provider. Ask your health care provider  what activities are safe for you. °· Rest as much as possible. Try to rest or take a nap when your baby is sleeping. °· Do not lift anything that is heavier than your baby or 10 lb (4.5 kg) until your health care provider says that it is safe. °· Talk with your health care provider about when you can engage in sexual activity. This may depend on your: °¨ Risk of infection. °¨ Rate of healing. °¨ Comfort and desire to engage in sexual activity. °Vaginal Care  °· If you have an episiotomy or a vaginal tear, check the area every day for signs of infection. Check for: °¨ More redness, swelling, or pain. °¨ More fluid or blood. °¨ Warmth. °¨ Pus or a bad smell. °· Do not use tampons or douches until your health care provider says this is safe. °· Watch for any blood clots that may pass from your vagina. These may look like clumps of dark red, brown, or black discharge. °General instructions  °· Keep your perineum clean and dry as told by your health care provider. °· Wear loose, comfortable clothing. °· Wipe from front to back when you use the toilet. °· Ask your health care provider if you can shower or take a bath. If you had an episiotomy or a perineal tear during labor and delivery, your health care provider may tell you not to take baths for a certain length of time. °· Wear a bra that supports your breasts and fits you well. °· If possible, have someone help you with household activities and help care for your baby for   at least a few days after you leave the hospital. °· Keep all follow-up visits for you and your baby as told by your health care provider. This is important. °Contact a health care provider if: °· You have: °¨ Vaginal discharge that has a bad smell. °¨ Difficulty urinating. °¨ Pain when urinating. °¨ A sudden increase or decrease in the frequency of your bowel movements. °¨ More redness, swelling, or pain around your episiotomy or vaginal tear. °¨ More fluid or blood coming from your episiotomy or  vaginal tear. °¨ Pus or a bad smell coming from your episiotomy or vaginal tear. °¨ A fever. °¨ A rash. °¨ Little or no interest in activities you used to enjoy. °¨ Questions about caring for yourself or your baby. °· Your episiotomy or vaginal tear feels warm to the touch. °· Your episiotomy or vaginal tear is separating or does not appear to be healing. °· Your breasts are painful, hard, or turn red. °· You feel unusually sad or worried. °· You feel nauseous or you vomit. °· You pass large blood clots from your vagina. If you pass a blood clot from your vagina, save it to show to your health care provider. Do not flush blood clots down the toilet without having your health care provider look at them. °· You urinate more than usual. °· You are dizzy or light-headed. °· You have not breastfed at all and you have not had a menstrual period for 12 weeks after delivery. °· You have stopped breastfeeding and you have not had a menstrual period for 12 weeks after you stopped breastfeeding. °Get help right away if: °· You have: °¨ Pain that does not go away or does not get better with medicine. °¨ Chest pain. °¨ Difficulty breathing. °¨ Blurred vision or spots in your vision. °¨ Thoughts about hurting yourself or your baby. °· You develop pain in your abdomen or in one of your legs. °· You develop a severe headache. °· You faint. °· You bleed from your vagina so much that you fill two sanitary pads in one hour. °This information is not intended to replace advice given to you by your health care provider. Make sure you discuss any questions you have with your health care provider. °Document Released: 02/02/2000 Document Revised: 07/19/2015 Document Reviewed: 02/19/2015 °Elsevier Interactive Patient Education © 2017 [employer] ° ° °Preeclampsia and Eclampsia °Preeclampsia is a serious condition that develops only during pregnancy. It is also called toxemia of pregnancy. This condition causes high blood pressure along  with other symptoms, such as swelling and headaches. These symptoms may develop as the condition gets worse. Preeclampsia may occur at 20 weeks of pregnancy or later. °Diagnosing and treating preeclampsia early is very important. If not treated early, it can cause serious problems for you and your baby. One problem it can lead to is eclampsia, which is a condition that causes muscle jerking or shaking (convulsions or seizures) in the mother. Delivering your baby is the best treatment for preeclampsia or eclampsia. Preeclampsia and eclampsia symptoms usually go away after your baby is born. °What are the causes? °The cause of preeclampsia is not known. °What increases the risk? °The following risk factors make you more likely to develop preeclampsia: °· Being pregnant for the first time. °· Having had preeclampsia during a past pregnancy. °· Having a family history of preeclampsia. °· Having high blood pressure. °· Being pregnant with twins or triplets. °· Being 35 or older. °· Being African-American. °·   Having kidney disease or diabetes. °· Having medical conditions such as lupus or blood diseases. °· Being very overweight (obese). °What are the signs or symptoms? °The earliest signs of preeclampsia are: °· High blood pressure. °· Increased protein in your urine. Your health care provider will check for this at every visit before you give birth (prenatal visit). °Other symptoms that may develop as the condition gets worse include: °· Severe headaches. °· Sudden weight gain. °· Swelling of the hands, face, legs, and feet. °· Nausea and vomiting. °· Vision problems, such as blurred or double vision. °· Numbness in the face, arms, legs, and feet. °· Urinating less than usual. °· Dizziness. °· Slurred speech. °· Abdominal pain, especially upper abdominal pain. °· Convulsions or seizures. °Symptoms generally go away after giving birth. °How is this diagnosed? °There are no screening tests for preeclampsia. Your health  care provider will ask you about symptoms and check for signs of preeclampsia during your prenatal visits. You may also have tests that include: °· Urine tests. °· Blood tests. °· Checking your blood pressure. °· Monitoring your baby’s heart rate. °· Ultrasound. °How is this treated? °You and your health care provider will determine the treatment approach that is best for you. Treatment may include: °· Having more frequent prenatal exams to check for signs of preeclampsia, if you have an increased risk for preeclampsia. °· Bed rest. °· Reducing how much salt (sodium) you eat. °· Medicine to lower your blood pressure. °· Staying in the hospital, if your condition is severe. There, treatment will focus on controlling your blood pressure and the amount of fluids in your body (fluid retention). °· You may need to take medicine (magnesium sulfate) to prevent seizures. This medicine may be given as an injection or through an IV tube. °· Delivering your baby early, if your condition gets worse. You may have your labor started with medicine (induced), or you may have a cesarean delivery. °Follow these instructions at home: °Eating and drinking  ° °· Drink enough fluid to keep your urine clear or pale yellow. °· Eat a healthy diet that is low in sodium. Do not add salt to your food. Check nutrition labels to see how much sodium a food or beverage contains. °· Avoid caffeine. °Lifestyle  °· Do not use any products that contain nicotine or tobacco, such as cigarettes and e-cigarettes. If you need help quitting, ask your health care provider. °· Do not use alcohol or drugs. °· Avoid stress as much as possible. Rest and get plenty of sleep. °General instructions  °· Take over-the-counter and prescription medicines only as told by your health care provider. °· When lying down, lie on your side. This keeps pressure off of your baby. °· When sitting or lying down, raise (elevate) your feet. Try putting some pillows underneath your  lower legs. °· Exercise regularly. Ask your health care provider what kinds of exercise are best for you. °· Keep all follow-up and prenatal visits as told by your health care provider. This is important. °How is this prevented? °To prevent preeclampsia or eclampsia from developing during another pregnancy: °· Get proper medical care during pregnancy. Your health care provider may be able to prevent preeclampsia or diagnose and treat it early. °· Your health care provider may have you take a low-dose aspirin or a calcium supplement during your next pregnancy. °· You may have tests of your blood pressure and kidney function after giving birth. °· Maintain a healthy weight. Ask your   health care provider for help managing weight gain during pregnancy. °· Work with your health care provider to manage any long-term (chronic) health conditions you have, such as diabetes or kidney problems. °Contact a health care provider if: °· You gain more weight than expected. °· You have headaches. °· You have nausea or vomiting. °· You have abdominal pain. °· You feel dizzy or light-headed. °Get help right away if: °· You develop sudden or severe swelling anywhere in your body. This usually happens in the legs. °· You gain 5 lbs (2.3 kg) or more during one week. °· You have severe: °¨ Abdominal pain. °¨ Headaches. °¨ Dizziness. °¨ Vision problems. °¨ Confusion. °¨ Nausea or vomiting. °· You have a seizure. °· You have trouble moving any part of your body. °· You develop numbness in any part of your body. °· You have trouble speaking. °· You have any abnormal bleeding. °· You pass out. °This information is not intended to replace advice given to you by your health care provider. Make sure you discuss any questions you have with your health care provider. °Document Released: 02/02/2000 Document Revised: 10/03/2015 Document Reviewed: 09/11/2015 °Elsevier Interactive Patient Education © 2017 Elsevier Inc. ° °

## 2016-03-16 NOTE — Discharge Summary (Signed)
OB Discharge Summary     Patient Name: Whitney Todd DOB: 30-Jul-1990 MRN: [id number]  Date of admission: 03/14/2016 Delivering MD:     Date of discharge: 03/16/2016  Admitting diagnosis: Live Birth at Home Intrauterine pregnancy: Unknown     Secondary diagnosis:  Active Problems:   Gestational hypertension, antepartum   NSVD (normal spontaneous vaginal delivery)  Additional problems: Severe Preeclampsia     Discharge diagnosis: Preeclampsia (severe) and No prenatal care                                                                                                Post partum procedures:Magnesium sulfate  Augmentation: None  Hospital course:  Patient had SVD at home of viable infant, unknown GA. No prenatal care.  History of preeclampsia, noted be hypertensive on arrival and ruled in for severe preeclampsia. She was given magnesium sulfate and was started on Amlodipine.  On day of discharge, BP was stable. She was ambulating, eating, and having no GU/GI difficulties. Deemed stable for discharge to home.   Physical exam  Vitals:   03/15/16 2040 03/16/16 0010 03/16/16 0115 03/16/16 0600  BP: 139/62 (!) 150/94 140/76 (!) 148/90  Pulse: 83 80 79 84  Resp: 16 16  18   Temp: 97.4 F (36.3 C) 98.6 F (37 C)  98.3 F (36.8 C)  TempSrc: Oral Oral  Oral  SpO2: 100% 100%  100%  Weight:      Height:       General: alert and no distress Lochia: appropriate Uterine Fundus: firm DVT Evaluation: No evidence of DVT seen on physical exam. Negative Homan's sign. No cords or calf tenderness. No significant calf/ankle edema. Labs: Lab Results  Component Value Date   WBC 15.9 (H) 03/14/2016   HGB 10.4 (L) 03/14/2016   HCT 31.2 (L) 03/14/2016   MCV 89.9 03/14/2016   PLT 288 03/14/2016   CMP Latest Ref Rng & Units 03/14/2016  Glucose 65 - 99 mg/dL 409(W)  BUN 6 - 20 mg/dL 8  Creatinine 1.19 - 1.47 mg/dL 8.29  Sodium 562 - 130 mmol/L 135  Potassium 3.5 - 5.1 mmol/L 3.3(L)   Chloride 101 - 111 mmol/L 103  CO2 22 - 32 mmol/L 22  Calcium 8.9 - 10.3 mg/dL 8.9  Total Protein 6.5 - 8.1 g/dL 8.6(V)  Total Bilirubin 0.3 - 1.2 mg/dL 0.4  Alkaline Phos 38 - 126 U/L 142(H)  AST 15 - 41 U/L 18  ALT 14 - 54 U/L 14    Discharge instruction: per After Visit Summary and "Baby and Me Booklet".  After visit meds:  Allergies as of 03/16/2016   No Known Allergies     Medication List    TAKE these medications   amLODipine 10 MG tablet Commonly known as:  NORVASC Take 1 tablet (10 mg total) by mouth daily.   ibuprofen 600 MG tablet Commonly known as:  ADVIL,MOTRIN Take 1 tablet (600 mg total) by mouth every 6 (six) hours.       Diet: routine diet  Activity: Advance as tolerated. Pelvic rest for 6 weeks.  Outpatient follow up:4 weeks Follow up Appt:Future Appointments Date Time Provider Department Center  04/17/2016 8:40 AM Duane LopeJennifer I Rasch, NP [facility]   Follow up Visit:No Follow-up on file.  Postpartum contraception: Nexplanon  Newborn Data: Live born female  Birth Weight: 6 lb 7.4 oz (2930 g) APGAR: ,   Baby Feeding: Bottle Disposition:NICU   03/16/2016 Jaynie CollinsUgonna Raley Novicki, MD

## 2016-03-21 NOTE — Progress Notes (Signed)
[facility] [employer], stating that she just wanted to call in pt's BP which is 146/94.  Tinnie GensJennie states that pt is currently taking Norvasc in the am daily.  She states that the pt was going through a lot of visits that day.  Tinnie GensJennie states that she would be following up with pt on Friday or early next week in regards to pt's BP.

## 2016-04-04 ENCOUNTER — Telehealth: Payer: Self-pay

## 2016-04-04 NOTE — Telephone Encounter (Signed)
Jamie from [employer] LM stating that the "pt is presenting with elevated BP's and is currently being managed on Norvasc 10 mg tablet.  On 03/20/16 BP was slightly elevated, 03/28/16 pt had a stressful day BP 154-156/96-98 with no sx, and on 04/03/16 taking Norvasc 10 mg BP RA 154/94 and LA 158/96".  Pt has pp visit scheduled of 04/17/16 is there anything the patient needs to do?  Called pt at both contact #'s and LM for pt to call the office to schedule a BP check with a nurse.  LM for Asher MuirJamie that I have LM at pt's contact #'s for pt to call to schedule BP check appt and if she has any questions to please give the office a call.

## 2016-04-11 NOTE — Telephone Encounter (Signed)
I called Jeannie RN and left message stating that we have reached out to the pt last week and have not had a response. I was wanting to know if she has been to see the pt since 2/14 and if so, what her BP values were. I requested Jeannie to call back and provide an update of information. I also left message for pt on her voicemail stating that we need her to call us today regarding important information. She may leave a message stating whether we can leave a detailed message on her voice mail. *Pt needs BP check this week - either by [employer] RN or pt amy come to our office.

## 2016-04-15 NOTE — Telephone Encounter (Signed)
Pt is scheduled for PP visit on 2/28.

## 2016-04-17 ENCOUNTER — Ambulatory Visit: Payer: Medicaid Other | Admitting: Obstetrics and Gynecology

## 2016-04-17 NOTE — Telephone Encounter (Signed)
Message left by Cy BlamerJeannie Church RN on 2/27 @ 1027 stating that she received my message last week but did not have a chance to go back to see Ms. Hochman for another BP check until yesterday (Tuesday). She stated that since pt has scheduled office appt on 2/28, she opted not to go and see pt since there "really was no point.".

## 2016-11-01 ENCOUNTER — Encounter (HOSPITAL_COMMUNITY): Payer: Self-pay | Admitting: Emergency Medicine

## 2016-11-01 ENCOUNTER — Emergency Department (HOSPITAL_COMMUNITY)
Admission: EM | Admit: 2016-11-01 | Discharge: 2016-11-01 | Disposition: A | Discharge status: Home / Self Care | Payer: Medicaid Other | Attending: Emergency Medicine | Admitting: Emergency Medicine

## 2016-11-01 DIAGNOSIS — Z5321 Procedure and treatment not carried out due to patient leaving prior to being seen by health care provider: Secondary | ICD-10-CM | POA: Diagnosis not present

## 2016-11-01 DIAGNOSIS — R51 Headache: Secondary | ICD-10-CM | POA: Diagnosis present

## 2016-11-01 NOTE — ED Notes (Signed)
Pt walked up to desk, yelling that she was not going to wait for 3 hours. Tried to encourage pt to stay, she has refused and walked out with no distress noted.

## 2016-11-01 NOTE — ED Triage Notes (Signed)
Pt reports frontal headache x2 months, has taken otc medications with no relief, pt also reports some sneezing, denies fevers.

## 2016-12-24 ENCOUNTER — Encounter (HOSPITAL_COMMUNITY): Payer: Self-pay

## 2016-12-24 ENCOUNTER — Emergency Department (HOSPITAL_COMMUNITY)
Admission: EM | Admit: 2016-12-24 | Discharge: 2016-12-24 | Disposition: A | Discharge status: Home / Self Care | Payer: Medicaid Other | Attending: Emergency Medicine | Admitting: Emergency Medicine

## 2016-12-24 ENCOUNTER — Other Ambulatory Visit: Payer: Self-pay

## 2016-12-24 DIAGNOSIS — R51 Headache: Secondary | ICD-10-CM | POA: Insufficient documentation

## 2016-12-24 DIAGNOSIS — O134 Gestational [pregnancy-induced] hypertension without significant proteinuria, complicating childbirth: Secondary | ICD-10-CM | POA: Diagnosis not present

## 2016-12-24 DIAGNOSIS — R519 Headache, unspecified: Secondary | ICD-10-CM

## 2016-12-24 DIAGNOSIS — Z3A00 Weeks of gestation of pregnancy not specified: Secondary | ICD-10-CM | POA: Insufficient documentation

## 2016-12-24 DIAGNOSIS — G8929 Other chronic pain: Secondary | ICD-10-CM

## 2016-12-24 DIAGNOSIS — Z79899 Other long term (current) drug therapy: Secondary | ICD-10-CM | POA: Insufficient documentation

## 2016-12-24 DIAGNOSIS — O9989 Other specified diseases and conditions complicating pregnancy, childbirth and the puerperium: Secondary | ICD-10-CM | POA: Insufficient documentation

## 2016-12-24 LAB — CBC
HEMATOCRIT: 29.3 % — AB (ref 36.0–46.0)
Hemoglobin: 9.8 g/dL — ABNORMAL LOW (ref 12.0–15.0)
MCH: 30.6 pg (ref 26.0–34.0)
MCHC: 33.4 g/dL (ref 30.0–36.0)
MCV: 91.6 fL (ref 78.0–100.0)
PLATELETS: 347 10*3/uL (ref 150–400)
RBC: 3.2 MIL/uL — AB (ref 3.87–5.11)
RDW: 14.1 % (ref 11.5–15.5)
WBC: 10.3 10*3/uL (ref 4.0–10.5)

## 2016-12-24 LAB — COMPREHENSIVE METABOLIC PANEL
ALBUMIN: 2.8 g/dL — AB (ref 3.5–5.0)
ALT: 10 U/L — ABNORMAL LOW (ref 14–54)
ANION GAP: 9 (ref 5–15)
AST: 14 U/L — ABNORMAL LOW (ref 15–41)
Alkaline Phosphatase: 58 U/L (ref 38–126)
BILIRUBIN TOTAL: 0.3 mg/dL (ref 0.3–1.2)
BUN: 9 mg/dL (ref 6–20)
CHLORIDE: 106 mmol/L (ref 101–111)
CO2: 20 mmol/L — ABNORMAL LOW (ref 22–32)
Calcium: 9.3 mg/dL (ref 8.9–10.3)
Creatinine, Ser: 0.62 mg/dL (ref 0.44–1.00)
GFR calc Af Amer: >60 mL/min (ref >60)
Glucose, Bld: 90 mg/dL (ref 65–99)
POTASSIUM: 3.7 mmol/L (ref 3.5–5.1)
Sodium: 135 mmol/L (ref 135–145)
TOTAL PROTEIN: 7.6 g/dL (ref 6.5–8.1)

## 2016-12-24 LAB — LIPASE, BLOOD: LIPASE: 21 U/L (ref 11–51)

## 2016-12-24 LAB — I-STAT BETA HCG BLOOD, ED (MC, WL, AP ONLY)
HCG, QUANTITATIVE: 1686.3 m[IU]/mL — AB (ref <5)
I-stat hCG, quantitative: 1786.6 m[IU]/mL — ABNORMAL HIGH (ref <5)

## 2016-12-24 MED ORDER — LABETALOL HCL 100 MG PO TABS: 100.0000 mg | ORAL_TABLET | Freq: Two times a day (BID) | ORAL | 0 refills | Status: DC

## 2016-12-24 MED ORDER — PRENATAL VITAMIN 27-0.8 MG PO TABS: 1.0000 | ORAL_TABLET | Freq: Every morning | ORAL | 0 refills | Status: DC

## 2016-12-24 NOTE — Discharge Instructions (Signed)
[facility] to schedule a follow-up appointment  Take Tylenol every 4 hours as needed for headache.  .   take the blood pressure medication as prescribed.  Your blood pressure should be rechecked in a week.

## 2016-12-24 NOTE — ED Triage Notes (Signed)
Pt states she has had ongoing headache for a year. Hx of HTN but does not take any medications. Pt states she also had GI upset yesterday, diarrhea and vomiting. Denies today. AOX4.

## 2016-12-24 NOTE — ED Notes (Signed)
ED Provider at bedside. 

## 2016-12-24 NOTE — ED Provider Notes (Signed)
[facility] Provider Note   CSN: [id number] Arrival date & time: 12/24/16  1517     History   Chief Complaint Chief Complaint  Patient presents with  . Headache    HPI Whitney Todd is a 26 y.o. female.  HPI Complains of headache left-sided temporal nonradiating onset approximately 6 months ago she gets headaches every other day treated with Tylenol without with relief.  No fever no trauma no visual changes no nausea or vomiting.  Patient also states she had upset stomach with diarrhea 5 episodes yesterday.  No episodes today.  Last normal menstrual period October 2018.  Presently headache mild.  Otherwise asymptomatic Past Medical History:  Diagnosis Date  . Pregnancy induced hypertension    not on meds    Patient Active Problem List   Diagnosis Date Noted  . NSVD (normal spontaneous vaginal delivery) 03/14/2016  . Hypertension complicating pregnancy 07/12/2014  . Gestational hypertension, antepartum 06/23/2014  . 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 Insufficient prenatal care in third trimester 06/02/2014    Past Surgical History:  Procedure Laterality Date  . NO PAST SURGERIES      OB History    Gravida Para Term Preterm AB Living   4 4 3  0 0 4   SAB TAB Ectopic Multiple Live Births   0 0 0 0 4       Home Medications    Prior to Admission medications   Medication Sig Start Date End Date Taking? Authorizing Provider  acetaminophen (TYLENOL) 500 MG tablet Take 1,000 mg every 6 (six) hours as needed by mouth for mild pain.   Yes [provider]  amLODipine (NORVASC) 10 MG tablet Take 1 tablet (10 mg total) by mouth daily. Patient not taking: Reported on 12/24/2016 03/16/16   Tereso NewcomerAnyanwu, Ugonna A, MD  ibuprofen (ADVIL,MOTRIN) 600 MG tablet Take 1 tablet (600 mg total) by mouth every 6 (six) hours. Patient not taking: Reported on 12/24/2016 03/16/16   Tereso NewcomerAnyanwu, Ugonna A, MD    Family History Family  History  Problem Relation Age of Onset  . Hypertension Mother   . Diabetes Father   . Hypertension Father   . Diabetes Paternal Grandmother     Social History Social History   Tobacco Use  . Smoking status: Never Smoker  . Smokeless tobacco: Never Used  Substance Use Topics  . Alcohol use: No  . Drug use: No     Allergies   Patient has no known allergies.   Review of Systems Review of Systems  Constitutional: Negative.   HENT: Negative.   Respiratory: Negative.   Cardiovascular: Negative.   Gastrointestinal: Positive for diarrhea.  Musculoskeletal: Negative.   Skin: Negative.   Neurological: Positive for headaches.  Psychiatric/Behavioral: Negative.   All other systems reviewed and are negative.    Physical Exam Updated Vital Signs BP (!) 160/98 (BP Location: Right Arm)   Pulse 97   Temp 98.4 F (36.9 C) (Oral)   Resp 16   LMP 12/16/2016 (Within Days)   SpO2 98%   Physical Exam  Constitutional: She is oriented to person, place, and time. She appears well-developed and well-nourished.  HENT:  Head: Normocephalic and atraumatic.  Eyes: Conjunctivae are normal. Pupils are equal, round, and reactive to light.  Neck: Neck supple. No tracheal deviation present. No thyromegaly present.  Cardiovascular: Normal rate and regular rhythm.  No murmur heard. Pulmonary/Chest: Effort normal and breath sounds normal.  Abdominal: Soft. Bowel sounds are  normal. She exhibits no distension. There is no tenderness.  Musculoskeletal: Normal range of motion. She exhibits no edema or tenderness.  Neurological: She is alert and oriented to person, place, and time. Coordination normal.  DTR symmetric bilaterally at knee jerk ankle jerk biceps was overgrown bilaterally gait normal Romberg normal pronator drift normal  Skin: Skin is warm and dry. No rash noted.  Psychiatric: She has a normal mood and affect.  Nursing note and vitals reviewed.    ED Treatments / Results   Labs (all labs ordered are listed, but only abnormal results are displayed) Labs Reviewed  COMPREHENSIVE METABOLIC PANEL - Abnormal; Notable for the following components:      Result Value   CO2 20 (*)    Albumin 2.8 (*)    AST 14 (*)    ALT 10 (*)    All other components within normal limits  CBC - Abnormal; Notable for the following components:   RBC 3.20 (*)    Hemoglobin 9.8 (*)    HCT 29.3 (*)    All other components within normal limits  I-STAT BETA HCG BLOOD, ED ([facility], [facility], [facility] ONLY) - Abnormal; Notable for the following components:   I-stat hCG, quantitative 1,786.6 (*)    All other components within normal limits  LIPASE, BLOOD  URINALYSIS, ROUTINE W REFLEX MICROSCOPIC  I-STAT BETA HCG BLOOD, ED ([facility], [facility], [facility] ONLY)    EKG  EKG Interpretation None       Radiology No results found.  Procedures Procedures (including critical care time)  Medications Ordered in ED Medications - No data to display   Initial Impression / Assessment and Plan / ED Course  I have reviewed the triage vital signs and the nursing notes.  Pertinent labs & imaging results that were available during my care of the patient were reviewed by me and considered in my medical decision making (see chart for details).     Patient has been advised that she is pregnant.  Pregnancy test was rechecked and verified.  She has no abdominal pain.  She is untreated for hypertension.  After consultation with pharmacy.  Prescription Normodyne written.  As well as prenatal vitamins.  [facility]  Prescriptions Normodyne, prenatal vitamins  Final Clinical Impressions(s) / ED Diagnoses  Diagnosis #1 chronic left-sided headaches Final diagnoses:  None   #2 hypertension #3 Pregnancy ED Discharge Orders    None       Doug SouJacubowitz, Kristi Norment, MD 12/24/16 2021

## 2016-12-24 NOTE — ED Notes (Signed)
Pt aware of need for urine sample, pt unable to provide one at this time

## 2017-02-18 NOTE — L&D Delivery Note (Signed)
Delivery Note Jacqlyn KraussVontasia J Lafrance is a 27 y.o. [id number] at 617w5d admitted for IOL d/t severe pre-e.  Labor course: she received 1 dose of buccal cytotec, a foley bulb, then progressed on her own w/o further augmentation. AROM clear fluid right before baby birthed. Received 1 dose IV Labetalol d/t severe-range bp's, so was also started on magnesium.  At 0516 a viable female was delivered via spontaneous vaginal delivery (Presentation: LOA ) by Claudine Moutonyler Watson, PA student, supervised by both me and Dr. Adrian BlackwaterStinson (who was called to be present b/c I was in another birth).  Infant placed directly on mom's abdomen for bonding/skin-to-skin. Delayed cord clamping x 1min, then cord clamped x 2, and cut by by pt's mom.  APGAR: 8,9 ; weight: pending at time of note.  40 units of pitocin diluted in 1000cc LR was infused rapidly IV per protocol. The placenta separated spontaneously and delivered via CCT and maternal pushing effort.  It was inspected and appears to be intact with a 3 VC.  Placenta/Cord with the following complications: none .  Cord pH: not done  Intrapartum complications:  none Anesthesia:  local for repair Episiotomy: none Lacerations:  Superficial Rt labial oozing, repaired Suture Repair: 4.0 vicryl Est. Blood Loss (mL): 200ml Sponge and instrument count were correct x2.  Mom to AICU for mag x24hrs  Baby to Couplet care / Skin to Skin. Placenta to L&D. Plans to bottlefeed Contraception: depo>interval BTL Circ: outpatient  Cheral MarkerKimberly R Ramond Darnell CNM, [facility]NP[facility]BC 04/02/2017 5:42 AM   Grace BushyBooker, Merlene LaughterKimberly R, CNM  P [facility] Admin Pool        Please schedule this patient for PP visit in: 1wk for BP check, then 4wks for PP visit  High risk pregnancy complicated by: no prenatal care, severe pre-e  Delivery mode: SVD  Anticipated Birth Control: Depo>interval BTL  PP Procedures needed: BP check & BTL  Schedule Integrated BH visit: no  Provider: MD (to schedule BTL)

## 2017-04-01 ENCOUNTER — Inpatient Hospital Stay (EMERGENCY_DEPARTMENT_HOSPITAL)
Admission: AD | Admit: 2017-04-01 | Discharge: 2017-04-01 | Disposition: A | Discharge status: Home / Self Care | Payer: Medicaid Other | Source: Ambulatory Visit | Attending: Obstetrics and Gynecology | Admitting: Obstetrics and Gynecology

## 2017-04-01 ENCOUNTER — Encounter (HOSPITAL_COMMUNITY): Payer: Self-pay | Admitting: *Deleted

## 2017-04-01 ENCOUNTER — Other Ambulatory Visit: Payer: Self-pay

## 2017-04-01 ENCOUNTER — Inpatient Hospital Stay (HOSPITAL_COMMUNITY): Payer: Medicaid Other

## 2017-04-01 ENCOUNTER — Inpatient Hospital Stay (HOSPITAL_COMMUNITY)
Admission: AD | Admit: 2017-04-01 | Discharge: 2017-04-04 | DRG: 806 | Disposition: A | Discharge status: Home / Self Care | Payer: Medicaid Other | Source: Ambulatory Visit | Attending: Family Medicine | Admitting: Family Medicine

## 2017-04-01 DIAGNOSIS — Z3A37 37 weeks gestation of pregnancy: Secondary | ICD-10-CM

## 2017-04-01 DIAGNOSIS — O99824 Streptococcus B carrier state complicating childbirth: Secondary | ICD-10-CM | POA: Diagnosis present

## 2017-04-01 DIAGNOSIS — O0933 Supervision of pregnancy with insufficient antenatal care, third trimester: Secondary | ICD-10-CM | POA: Diagnosis not present

## 2017-04-01 DIAGNOSIS — A568 Sexually transmitted chlamydial infection of other sites: Secondary | ICD-10-CM | POA: Diagnosis present

## 2017-04-01 DIAGNOSIS — O1413 Severe pre-eclampsia, third trimester: Secondary | ICD-10-CM | POA: Diagnosis not present

## 2017-04-01 DIAGNOSIS — O368390 Maternal care for abnormalities of the fetal heart rate or rhythm, unspecified trimester, not applicable or unspecified: Secondary | ICD-10-CM

## 2017-04-01 DIAGNOSIS — O1490 Unspecified pre-eclampsia, unspecified trimester: Secondary | ICD-10-CM | POA: Diagnosis present

## 2017-04-01 DIAGNOSIS — O1414 Severe pre-eclampsia complicating childbirth: Principal | ICD-10-CM | POA: Diagnosis present

## 2017-04-01 DIAGNOSIS — Z3493 Encounter for supervision of normal pregnancy, unspecified, third trimester: Secondary | ICD-10-CM

## 2017-04-01 DIAGNOSIS — O9832 Other infections with a predominantly sexual mode of transmission complicating childbirth: Secondary | ICD-10-CM | POA: Diagnosis present

## 2017-04-01 DIAGNOSIS — Z3689 Encounter for other specified antenatal screening: Secondary | ICD-10-CM

## 2017-04-01 DIAGNOSIS — O134 Gestational [pregnancy-induced] hypertension without significant proteinuria, complicating childbirth: Secondary | ICD-10-CM | POA: Diagnosis present

## 2017-04-01 LAB — URINALYSIS, ROUTINE W REFLEX MICROSCOPIC
BILIRUBIN URINE: NEGATIVE
Glucose, UA: NEGATIVE mg/dL
Hgb urine dipstick: NEGATIVE
KETONES UR: NEGATIVE mg/dL
Nitrite: NEGATIVE
Protein, ur: 30 mg/dL — AB
RBC / HPF: NONE SEEN RBC/hpf (ref 0–5)
SPECIFIC GRAVITY, URINE: 1.019 (ref 1.005–1.030)
pH: 6 (ref 5.0–8.0)

## 2017-04-01 LAB — RAPID HIV SCREEN (HIV 1/2 AB+AG)
HIV 1/2 Antibodies: NONREACTIVE
HIV-1 P24 Antigen - HIV24: NONREACTIVE

## 2017-04-01 LAB — COMPREHENSIVE METABOLIC PANEL
ALK PHOS: 90 U/L (ref 38–126)
ALT: 19 U/L (ref 14–54)
ALT: 18 U/L (ref 14–54)
ANION GAP: 7 (ref 5–15)
AST: 23 U/L (ref 15–41)
AST: 26 U/L (ref 15–41)
Albumin: 2.7 g/dL — ABNORMAL LOW (ref 3.5–5.0)
Albumin: 2.7 g/dL — ABNORMAL LOW (ref 3.5–5.0)
Alkaline Phosphatase: 86 U/L (ref 38–126)
Anion gap: 9 (ref 5–15)
BUN: 8 mg/dL (ref 6–20)
BUN: 9 mg/dL (ref 6–20)
CALCIUM: 8.5 mg/dL — AB (ref 8.9–10.3)
CO2: 21 mmol/L — ABNORMAL LOW (ref 22–32)
CO2: 23 mmol/L (ref 22–32)
Calcium: 8.3 mg/dL — ABNORMAL LOW (ref 8.9–10.3)
Chloride: 104 mmol/L (ref 101–111)
Chloride: 104 mmol/L (ref 101–111)
Creatinine, Ser: 0.62 mg/dL (ref 0.44–1.00)
Creatinine, Ser: 0.69 mg/dL (ref 0.44–1.00)
GFR calc Af Amer: >60 mL/min (ref >60)
GFR calc non Af Amer: >60 mL/min (ref >60)
Glucose, Bld: 84 mg/dL (ref 65–99)
Glucose, Bld: 82 mg/dL (ref 65–99)
Potassium: 3.3 mmol/L — ABNORMAL LOW (ref 3.5–5.1)
Potassium: 3.7 mmol/L (ref 3.5–5.1)
Sodium: 134 mmol/L — ABNORMAL LOW (ref 135–145)
Sodium: 134 mmol/L — ABNORMAL LOW (ref 135–145)
TOTAL PROTEIN: 7.2 g/dL (ref 6.5–8.1)
Total Bilirubin: 0.6 mg/dL (ref 0.3–1.2)
Total Bilirubin: 0.5 mg/dL (ref 0.3–1.2)
Total Protein: 7.5 g/dL (ref 6.5–8.1)

## 2017-04-01 LAB — CBC
HCT: 27.6 % — ABNORMAL LOW (ref 36.0–46.0)
HEMATOCRIT: 27.7 % — AB (ref 36.0–46.0)
Hemoglobin: 9 g/dL — ABNORMAL LOW (ref 12.0–15.0)
Hemoglobin: 9 g/dL — ABNORMAL LOW (ref 12.0–15.0)
MCH: 28.8 pg (ref 26.0–34.0)
MCH: 28.8 pg (ref 26.0–34.0)
MCHC: 32.6 g/dL (ref 30.0–36.0)
MCHC: 32.5 g/dL (ref 30.0–36.0)
MCV: 88.2 fL (ref 78.0–100.0)
MCV: 88.5 fL (ref 78.0–100.0)
PLATELETS: 296 10*3/uL (ref 150–400)
Platelets: 269 10*3/uL (ref 150–400)
RBC: 3.13 MIL/uL — ABNORMAL LOW (ref 3.87–5.11)
RBC: 3.13 MIL/uL — ABNORMAL LOW (ref 3.87–5.11)
RDW: 14.4 % (ref 11.5–15.5)
RDW: 14.4 % (ref 11.5–15.5)
WBC: 9 10*3/uL (ref 4.0–10.5)
WBC: 9.2 10*3/uL (ref 4.0–10.5)

## 2017-04-01 LAB — RAPID URINE DRUG SCREEN, HOSP PERFORMED
Amphetamines: NOT DETECTED
BENZODIAZEPINES: NOT DETECTED
Barbiturates: NOT DETECTED
Cocaine: NOT DETECTED
Opiates: NOT DETECTED
TETRAHYDROCANNABINOL: POSITIVE — AB

## 2017-04-01 LAB — GROUP B STREP BY PCR: Group B strep by PCR: POSITIVE — AB

## 2017-04-01 LAB — DIFFERENTIAL
BASOS ABS: 0 10*3/uL (ref 0.0–0.1)
BASOS PCT: 0 %
Eosinophils Absolute: 0 10*3/uL (ref 0.0–0.7)
Eosinophils Relative: 0 %
LYMPHS PCT: 32 %
Lymphs Abs: 2.9 10*3/uL (ref 0.7–4.0)
Monocytes Absolute: 0.2 10*3/uL (ref 0.1–1.0)
Monocytes Relative: 2 %
NEUTROS ABS: 6 10*3/uL (ref 1.7–7.7)
Neutrophils Relative %: 66 %

## 2017-04-01 LAB — TYPE AND SCREEN
ABO/RH(D): A POS
ANTIBODY SCREEN: NEGATIVE

## 2017-04-01 LAB — PROTEIN / CREATININE RATIO, URINE
CREATININE, URINE: 205 mg/dL
Creatinine, Urine: 192 mg/dL
Protein Creatinine Ratio: 0.18 mg/mg{Cre} — ABNORMAL HIGH (ref 0.00–0.15)
Protein Creatinine Ratio: 0.11 mg/mg{Cre} (ref 0.00–0.15)
TOTAL PROTEIN, URINE: 23 mg/dL
Total Protein, Urine: 35 mg/dL

## 2017-04-01 LAB — HCG, QUANTITATIVE, PREGNANCY: hCG, Beta Chain, Quant, S: 5091 m[IU]/mL — ABNORMAL HIGH (ref <5)

## 2017-04-01 LAB — POCT PREGNANCY, URINE: PREG TEST UR: POSITIVE — AB

## 2017-04-01 MED ORDER — OXYCODONE-ACETAMINOPHEN 5-325 MG PO TABS: 1.0000 | ORAL_TABLET | ORAL | Status: DC | PRN

## 2017-04-01 MED ORDER — OXYCODONE-ACETAMINOPHEN 5-325 MG PO TABS
2.0000 | ORAL_TABLET | ORAL | Status: DC | PRN
  Administered 2017-04-02: 2 via ORAL
  Filled 2017-04-01: qty 2

## 2017-04-01 MED ORDER — LABETALOL HCL 5 MG/ML IV SOLN
INTRAVENOUS | Status: AC
  Filled 2017-04-01: qty 4

## 2017-04-01 MED ORDER — OXYTOCIN 40 UNITS IN LACTATED RINGERS INFUSION - SIMPLE MED
2.5000 [IU]/h | INTRAVENOUS | Status: DC
  Administered 2017-04-02: 2.5 [IU]/h via INTRAVENOUS
  Filled 2017-04-01: qty 1000

## 2017-04-01 MED ORDER — ONDANSETRON HCL 4 MG/2ML IJ SOLN: 4.0000 mg | Freq: Four times a day (QID) | INTRAMUSCULAR | Status: DC | PRN

## 2017-04-01 MED ORDER — PENICILLIN G POT IN DEXTROSE 60000 UNIT/ML IV SOLN
3.0000 10*6.[IU] | INTRAVENOUS | Status: DC
  Administered 2017-04-02 (×2): 3 10*6.[IU] via INTRAVENOUS
  Filled 2017-04-01 (×4): qty 50

## 2017-04-01 MED ORDER — MISOPROSTOL 50MCG HALF TABLET
50.0000 ug | ORAL_TABLET | ORAL | Status: DC | PRN
  Administered 2017-04-01: 50 ug via BUCCAL
  Filled 2017-04-01 (×2): qty 1

## 2017-04-01 MED ORDER — LABETALOL HCL 100 MG PO TABS
200.0000 mg | ORAL_TABLET | Freq: Once | ORAL | Status: AC
  Administered 2017-04-01: 200 mg via ORAL
  Filled 2017-04-01: qty 2

## 2017-04-01 MED ORDER — LABETALOL HCL 5 MG/ML IV SOLN
20.0000 mg | INTRAVENOUS | Status: AC | PRN
  Administered 2017-04-01 – 2017-04-02 (×2): 20 mg via INTRAVENOUS
  Administered 2017-04-02: 40 mg via INTRAVENOUS
  Filled 2017-04-01: qty 4
  Filled 2017-04-01: qty 8

## 2017-04-01 MED ORDER — OXYTOCIN BOLUS FROM INFUSION
500.0000 mL | Freq: Once | INTRAVENOUS | Status: AC
  Administered 2017-04-02: 500 mL via INTRAVENOUS

## 2017-04-01 MED ORDER — LACTATED RINGERS IV SOLN: 500.0000 mL | INTRAVENOUS | Status: DC | PRN

## 2017-04-01 MED ORDER — LABETALOL HCL 5 MG/ML IV SOLN: 20.0000 mg | Freq: Once | INTRAVENOUS | Status: DC

## 2017-04-01 MED ORDER — SODIUM CHLORIDE 0.9 % IV SOLN
5.0000 10*6.[IU] | Freq: Once | INTRAVENOUS | Status: AC
  Administered 2017-04-01: 5 10*6.[IU] via INTRAVENOUS
  Filled 2017-04-01: qty 5

## 2017-04-01 MED ORDER — TERBUTALINE SULFATE 1 MG/ML IJ SOLN: 0.2500 mg | Freq: Once | INTRAMUSCULAR | Status: DC | PRN

## 2017-04-01 MED ORDER — HYDRALAZINE HCL 20 MG/ML IJ SOLN: 10.0000 mg | Freq: Once | INTRAMUSCULAR | Status: DC | PRN

## 2017-04-01 MED ORDER — LIDOCAINE HCL (PF) 1 % IJ SOLN
30.0000 mL | INTRAMUSCULAR | Status: AC | PRN
  Administered 2017-04-02: 30 mL via SUBCUTANEOUS
  Filled 2017-04-01: qty 30

## 2017-04-01 MED ORDER — MAGNESIUM SULFATE BOLUS VIA INFUSION
4.0000 g | Freq: Once | INTRAVENOUS | Status: AC
  Administered 2017-04-01: 4 g via INTRAVENOUS
  Filled 2017-04-01: qty 500

## 2017-04-01 MED ORDER — LACTATED RINGERS IV SOLN
INTRAVENOUS | Status: DC
  Administered 2017-04-01: 18:00:00 via INTRAVENOUS

## 2017-04-01 MED ORDER — BUTALBITAL-APAP-CAFFEINE 50-325-40 MG PO TABS
2.0000 | ORAL_TABLET | Freq: Once | ORAL | Status: AC
  Administered 2017-04-01: 2 via ORAL
  Filled 2017-04-01: qty 2

## 2017-04-01 MED ORDER — ACETAMINOPHEN 325 MG PO TABS: 650.0000 mg | ORAL_TABLET | ORAL | Status: DC | PRN

## 2017-04-01 MED ORDER — MAGNESIUM SULFATE 40 G IN LACTATED RINGERS - SIMPLE
2.0000 g/h | INTRAVENOUS | Status: DC
  Filled 2017-04-01: qty 500

## 2017-04-01 MED ORDER — SOD CITRATE-CITRIC ACID 500-334 MG/5ML PO SOLN: 30.0000 mL | ORAL | Status: DC | PRN

## 2017-04-01 MED ORDER — ACETAMINOPHEN 500 MG PO TABS: 1000.0000 mg | ORAL_TABLET | Freq: Once | ORAL | Status: DC

## 2017-04-01 MED ORDER — LACTATED RINGERS IV SOLN: INTRAVENOUS | Status: DC

## 2017-04-01 MED ORDER — FENTANYL CITRATE (PF) 100 MCG/2ML IJ SOLN
100.0000 ug | INTRAMUSCULAR | Status: DC | PRN
  Administered 2017-04-02 (×3): 100 ug via INTRAVENOUS
  Filled 2017-04-01 (×3): qty 2

## 2017-04-01 NOTE — MAU Note (Signed)
Pt. Back from radiology, 3rd trimester pregnancy, EFM applied - FHR 150s, Toco applied - abd. Soft.

## 2017-04-01 NOTE — Anesthesia Pain Management Evaluation Note (Signed)
  CRNA Pain Management Visit Note  Patient: Whitney Todd, 27 y.o., female  "Hello I am a member of the anesthesia team at [facility]. We have an anesthesia team available at all times to provide care throughout the hospital, including epidural management and anesthesia for C-section. I don't know your plan for the delivery whether it a natural birth, water birth, IV sedation, nitrous supplementation, doula or epidural, but we want to meet your pain goals."   1.Was your pain managed to your expectations on prior hospitalizations?   Yes, but very unhappy with side effect of itching.  2.What is your expectation for pain management during this hospitalization?     Unsure. Ansthesiologist to talk to patient.  3.How can we help you reach that goal? Provide method of pain control that patient desires.  Record the patient's initial score and the patient's pain goal.   Pain: 3  Pain Goal: 6 [facility] wants you to be able to say your pain was always managed very well.  Whitney Todd 04/01/2017

## 2017-04-01 NOTE — MAU Note (Signed)
Pt. still in radiology for u/s.

## 2017-04-01 NOTE — MAU Provider Note (Signed)
Chief Complaint: Other Whitney Todd, with right eye twitching )   First Provider Initiated Contact with Patient 04/01/17 [phone]     SUBJECTIVE HPI: Whitney Todd is a 27 y.o. [id number] at Unknown gestation by patient who presents to maternity admissions reporting swollen Todd, HA, and right eye twitching. Patient reports going to [facility] ED in November for HA and finding out she was pregnant. Hypertension medication of Labetalol 100mg  BID was prescribed to patient but never started- pt states "she did not know the medication was sent to the pharmacy". She reports her last LMP was 12/16/2016. She reports that Friday night her right eye started twitching. On Saturday morning she woke up to her face and Todd bring swollen. Today she presents to MAU with a HA that started this morning. She rates HA 2/10- has not taken anything for pain medication. She denies having chronic hypertension, states she only has problems during pregnancies with blood pressure. She denies abdominal pain/cramping, vaginal bleeding, vision changes, epigastric pain, vaginal itching/burning, urinary symptoms, dizziness, n/v, or fever/chills. She reports she wants to start prenatal care downstairs, has not started care because when she found out she was pregnant in November wanted to have a TAB but decided to keep the pregnancy.    Past Medical History:  Diagnosis Date  . Pregnancy induced hypertension    not on meds   Past Surgical History:  Procedure Laterality Date  . NO PAST SURGERIES     Social History   Socioeconomic History  . Marital status: Single    Spouse name: Not on file  . Number of children: Not on file  . Years of education: Not on file  . Highest education level: Not on file  Social Needs  . Financial resource strain: Not on file  . Food insecurity - worry: Not on file  . Food insecurity - inability: Not on file  . Transportation needs - medical: Not on file  . Transportation needs - non-medical: Not  on file  Occupational History  . Not on file  Tobacco Use  . Smoking status: Never Smoker  . Smokeless tobacco: Never Used  Substance and Sexual Activity  . Alcohol use: No  . Drug use: No  . Sexual activity: No    Birth control/protection: None  Other Topics Concern  . Not on file  Social History Narrative  . Not on file   No current facility-administered medications on file prior to encounter.    Current Outpatient Medications on File Prior to Encounter  Medication Sig Dispense Refill  . acetaminophen (TYLENOL) 500 MG tablet Take 1,000 mg every 6 (six) hours as needed by mouth for mild pain.    Marland Kitchen amLODipine (NORVASC) 10 MG tablet Take 1 tablet (10 mg total) by mouth daily. (Patient not taking: Reported on 12/24/2016) 30 tablet 1  . ibuprofen (ADVIL,MOTRIN) 600 MG tablet Take 1 tablet (600 mg total) by mouth every 6 (six) hours. (Patient not taking: Reported on 12/24/2016) 30 tablet 0  . labetalol (NORMODYNE) 100 MG tablet Take 1 tablet (100 mg total) 2 (two) times daily by mouth. 60 tablet 0  . Prenatal Vit-Fe Fumarate-FA (PRENATAL VITAMIN) 27-0.8 MG TABS Take 1 tablet every morning by mouth. 30 tablet 0   No Known Allergies  ROS:  Review of Systems  Constitutional: Negative.   HENT: Positive for facial swelling.        Lip swollen  Eyes: Negative for visual disturbance.       Right eye twitch (  not currently)  Respiratory: Negative.   Cardiovascular: Negative.   Gastrointestinal: Negative.   Genitourinary: Negative.   Musculoskeletal: Negative.   Neurological: Positive for headaches. Negative for dizziness, seizures, weakness and light-headedness.  Psychiatric/Behavioral: Negative.    I have reviewed patient's Past Medical Hx, Surgical Hx, Family Hx, Social Hx, medications and allergies.   Physical Exam   Patient Vitals for the past 24 hrs:  BP Pulse SpO2 Weight  04/01/17 1120 (!) 154/96 91 - -  04/01/17 1017 (!) 148/120 86 - -  04/01/17 1005 (!) 164/101 87 - -   04/01/17 0950 - - 100 % -  04/01/17 0945 (!) 152/99 96 100 % -  04/01/17 0940 - - 99 % -  04/01/17 0935 - - 99 % -  04/01/17 0930 (!) 158/115 96 99 % -  04/01/17 0917 (!) 160/109 92 - -  04/01/17 0909 - - - 241 lb 4 oz (109.4 kg)   Constitutional: Well-developed, well-nourished female in no acute distress.  Cardiovascular: normal rate Respiratory: normal effort GI: Abd gravid appropriate for third trimester fetus. Fundus is noted above umbilicus.  MS: Extremities nontender, no edema, normal ROM, no clonus, DTR +1  Neurologic: Alert and oriented x 4.  GU: Neg CVAT.  FHR: baseline 150/ moderate/ +accels/ no decel  Toco: no contractions noted on monitor   LAB RESULTS Results for orders placed or performed during the hospital encounter of 04/01/17 (from the past 24 hour(s))  Protein / creatinine ratio, urine     Status: Abnormal   Collection Time: 04/01/17  9:05 AM  Result Value Ref Range   Creatinine, Urine 192.00 mg/dL   Total Protein, Urine 35 mg/dL   Protein Creatinine Ratio 0.18 (H) 0.00 - 0.15 mg/mg[Cre]  Urinalysis, Routine w reflex microscopic     Status: Abnormal   Collection Time: 04/01/17  9:09 AM  Result Value Ref Range   Color, Urine YELLOW YELLOW   APPearance CLOUDY (A) CLEAR   Specific Gravity, Urine 1.019 1.005 - 1.030   pH 6.0 5.0 - 8.0   Glucose, UA NEGATIVE NEGATIVE mg/dL   Hgb urine dipstick NEGATIVE NEGATIVE   Bilirubin Urine NEGATIVE NEGATIVE   Ketones, ur NEGATIVE NEGATIVE mg/dL   Protein, ur 30 (A) NEGATIVE mg/dL   Nitrite NEGATIVE NEGATIVE   Leukocytes, UA MODERATE (A) NEGATIVE   RBC / HPF NONE SEEN 0 - 5 RBC/hpf   WBC, UA TOO NUMEROUS TO COUNT 0 - 5 WBC/hpf   Bacteria, UA RARE (A) NONE SEEN   Squamous Epithelial / LPF TOO NUMEROUS TO COUNT (A) NONE SEEN   Mucus PRESENT   Pregnancy, urine POC     Status: Abnormal   Collection Time: 04/01/17  9:26 AM  Result Value Ref Range   Preg Test, Ur POSITIVE (A) NEGATIVE  CBC     Status: Abnormal    Collection Time: 04/01/17  9:59 AM  Result Value Ref Range   WBC 9.0 4.0 - 10.5 K/uL   RBC 3.13 (L) 3.87 - 5.11 MIL/uL   Hemoglobin 9.0 (L) 12.0 - 15.0 g/dL   HCT 16.1 (L) 09.6 - 04.5 %   MCV 88.2 78.0 - 100.0 fL   MCH 28.8 26.0 - 34.0 pg   MCHC 32.6 30.0 - 36.0 g/dL   RDW 40.9 81.1 - 91.4 %   Platelets 269 150 - 400 K/uL  Comprehensive metabolic panel     Status: Abnormal   Collection Time: 04/01/17  9:59 AM  Result Value Ref Range  Sodium 134 (L) 135 - 145 mmol/L   Potassium 3.3 (L) 3.5 - 5.1 mmol/L   Chloride 104 101 - 111 mmol/L   CO2 21 (L) 22 - 32 mmol/L   Glucose, Bld 84 65 - 99 mg/dL   BUN 8 6 - 20 mg/dL   Creatinine, Ser 8.29 0.44 - 1.00 mg/dL   Calcium 8.3 (L) 8.9 - 10.3 mg/dL   Total Protein 7.5 6.5 - 8.1 g/dL   Albumin 2.7 (L) 3.5 - 5.0 g/dL   AST 23 15 - 41 U/L   ALT 19 14 - 54 U/L   Alkaline Phosphatase 86 38 - 126 U/L   Total Bilirubin 0.6 0.3 - 1.2 mg/dL   GFR calc non Af Amer >60 >60 mL/min   GFR calc Af Amer >60 >60 mL/min   Anion gap 9 5 - 15  hCG, quantitative, pregnancy     Status: Abnormal   Collection Time: 04/01/17  9:59 AM  Result Value Ref Range   hCG, Beta Chain, Quant, S 5,091 (H) <5 mIU/mL    IMAGING [place] Mfm Ob Detail +14 Wk  Result Date: 04/01/2017 ----------------------------------------------------------------------  OBSTETRICS REPORT                      (Signed Final 04/01/2017 01:25 pm) ---------------------------------------------------------------------- Patient Info  ID #:       [id number]                          D.O.B.:  10/08/1990 (26 yrs)  Name:       Whitney Todd             Visit Date: 04/01/2017 11:00 am ---------------------------------------------------------------------- Performed By  Performed By:     Hurman Horn          Referred By:      MAU Nursing-                    RDMS                                     MAU/Triage  Attending:        Charlsie Merles MD         Location:         [facility]  ---------------------------------------------------------------------- Orders   #  Description                                 Code   1  [place] MFM OB DETAIL +14 WK                     76811.01  ----------------------------------------------------------------------   #  Ordered By               Order #        Accession #    Episode #   1  Suzette Battiest Sanyiah Kanzler          [id number]      [id number]     [id number]  ---------------------------------------------------------------------- Indications   [redacted] weeks gestation of pregnancy                Z3A.37   Encounter for uncertain dates  Z36.87   Obesity complicating pregnancy, third          O99.213   trimester   Insufficient Prenatal Care (No care, thought   O09.30   she was [redacted]w[redacted]d)   Encounter for fetal anatomic survey            Z36.89  ---------------------------------------------------------------------- OB History  Blood Type:            Height:  5'10"  Weight (lb):  241       BMI:  34.58  Gravidity:    5         Term:   4        Prem:   0        SAB:   0  TOP:          0       Ectopic:  0        Living: 4 ---------------------------------------------------------------------- Fetal Evaluation  Num Of Fetuses:     1  Fetal Heart         130  Rate(bpm):  Cardiac Activity:   Observed  Presentation:       Cephalic  Placenta:           Anterior, above cervical os  P. Cord Insertion:  Visualized, central  Amniotic Fluid  AFI FV:      Subjectively within normal limits  AFI Sum(cm)     %Tile       Largest Pocket(cm)  15.1            57          5.61  RUQ(cm)       RLQ(cm)       LUQ(cm)        LLQ(cm)  4.68          5.61          1.57           3.24 ---------------------------------------------------------------------- Biometry  BPD:      91.2  mm     G. Age:  37w 0d         54  %    CI:        79.28   %    70 - 86                                                          FL/HC:      21.6   %    20.9 - 22.7  HC:      323.8  mm     G. Age:  36w 5d         11  %    HC/AC:       0.89        0.92 - 1.05  AC:      365.8  mm     G. Age:  40w 3d       > 97  %    FL/BPD:     76.9   %    71 - 87  FL:       70.1  mm     G. Age:  36w 0d         15  %    FL/AC:  19.2   %    20 - 24  Est. FW:    3558  gm    7 lb 14 oz    > 90  % ---------------------------------------------------------------------- Gestational Age  LMP:           15w 1d        Date:  12/16/16                 EDD:   09/22/17  U/S Today:     37w 4d                                        EDD:   04/18/17  Best:          37w 4d     Det. By:  U/S (04/01/17)           EDD:   04/18/17 ---------------------------------------------------------------------- Anatomy  Cranium:               Appears normal         Aortic Arch:            Not well visualized  Cavum:                 Appears normal         Ductal Arch:            Not well visualized  Ventricles:            Appears normal         Diaphragm:              Appears normal  Choroid Plexus:        Appears normal         Stomach:                Appears normal, left                                                                        sided  Cerebellum:            Not well visualized    Abdomen:                Appears normal  Posterior Fossa:       Not well visualized    Abdominal Wall:         Not well visualized  Nuchal Fold:           Not applicable (>20    Cord Vessels:           Not well visualized                         wks GA)  Face:                  Orbits nl; profile not Kidneys:                Appear normal                         well visualized  Todd:  Appears normal         Bladder:                Appears normal  Thoracic:              Appears normal         Spine:                  Ltd views no                                                                        intracranial signs of                                                                        NT  Heart:                 Not well visualized    Upper Extremities:      Not well visualized  RVOT:                   Not well visualized    Lower Extremities:      Visualized  LVOT:                  Not well visualized  Other:  Fetus appears to be a female. Technically difficult due to advanced GA          and fetal position. ---------------------------------------------------------------------- Impression  Singleton intrauterine pregnancy at 37+4 weeks with no PNC  here for anatomic survey  Review of the anatomy shows no sonographic markers for  aneuploidy or structural anomalies  However, almost all views should be considered suboptimal  secondary to maternal body habitus and the late gestational  age  Amniotic fluid volume is normal  Estimated fetal weight shows growth at >90th percentile ---------------------------------------------------------------------- Recommendations  Continue clinical evaluation and management. ----------------------------------------------------------------------                 Charlsie MerlesMark Newman, MD Electronically Signed Final Report   04/01/2017 01:25 pm ----------------------------------------------------------------------   MAU Management/MDM: Orders Placed This Encounter  Procedures  . US MFM OB LIMITED  . Urinalysis, Routine w reflex microscopic  . CBC  . Comprehensive metabolic panel  . Protein / creatinine ratio, urine  . hCG, quantitative, pregnancy  . Pregnancy, urine POC  Pre E labs- WNL, PCR 0.18  US- shows patient is not 16 weeks as patient stated and is 37+ weeks  Meds ordered this encounter  Medications  . DISCONTD: labetalol (NORMODYNE,TRANDATE) injection 20 mg  . DISCONTD: lactated ringers infusion  . labetalol (NORMODYNE) tablet 200 mg   Treatments in MAU included 200mg  labetalol PO. Unable to gain IV access for hypertension medication- 200mg  labetalol PO given while access is being obtained.   Consult Dr Jolayne Pantheronstant with assessment, ultrasound results and labs. Recommends admission to L&D for IOL and Magnesium.   ASSESSMENT 1.      Severe Preeclampsia,  third trimester  2. No prenatal care in current pregnancy in third trimester   3. Encounter for fetal anatomic survey   4. Pregnancy with uncertain dates in third trimester     PLAN OB panel labs, GBS, and GC/C to be obtained in MAU prior to Admission Admit to L&D- patient reports being unable to stay right now, "has to go get her kids situated and notify work". She reports that she will return later tonight around 6pm.  Educated on the risks of leaving AMA to her with possibility of having seizure d/t BP and risk to baby with increase chance of abruption. Pt understands and signed out AMA.  Labs to be obtained during direct admission at 6pm d/t patient signing out AMA and not waiting for labs to be drawn.    Steward Drone  Certified Nurse-Midwife 04/01/2017  12:37 PM

## 2017-04-01 NOTE — MAU Note (Signed)
Pt. Wants to leave, AMA paperwork signed by patient with her mother at the bedside.  Labor precautions reviewed with patient, patient, "I will be back tonight at 6:00 p,m.  Pt. States she needs to get her other children settled before committing to a hospital stay. Provider discussed urgency of admission, pt. still desires to go home.

## 2017-04-01 NOTE — MAU Note (Signed)
Friday night while at home, right eye started twitching, next morning lips swelling. Head is now hurting 2/10.

## 2017-04-01 NOTE — H&P (Signed)
LABOR AND DELIVERY ADMISSION HISTORY AND PHYSICAL NOTE  Whitney Todd is a 27 y.o. female [phone] with IUP at [redacted]w[redacted]d by [place] presenting for IOL gHTN in severe range in MAU. Patient was seen in MAU earlier today with elevated pressures and headache. Patient was recommended for admission but left AMA and returned at around 6pm. Reports headache not resolved by tylenol. Denies vision changes but notes eye twitching. Denies LE edema or RUQ pain.   She reports positive fetal movement. She denies leakage of fluid or vaginal bleeding.  Prenatal History/Complications: No PNC  Pregnancy complications:  - no prenatal care - elevated BP   Sono: @[redacted]w[redacted]d , CWD, not well visualized anatomy due to gestational age, cephalic presentation, placenta anterior above cervical os, 3558g, >90% EFW  Past Medical History: Past Medical History:  Diagnosis Date  . Pregnancy induced hypertension    not on meds    Past Surgical History: Past Surgical History:  Procedure Laterality Date  . NO PAST SURGERIES      Obstetrical History: OB History    Gravida Para Term Preterm AB Living   5 4 3  0 0 4   SAB TAB Ectopic Multiple Live Births   0 0 0 0 4      Social History: Social History   Socioeconomic History  . Marital status: Single    Spouse name: None  . Number of children: None  . Years of education: None  . Highest education level: None  Social Needs  . Financial resource strain: None  . Food insecurity - worry: None  . Food insecurity - inability: None  . Transportation needs - medical: None  . Transportation needs - non-medical: None  Occupational History  . None  Tobacco Use  . Smoking status: Never Smoker  . Smokeless tobacco: Never Used  Substance and Sexual Activity  . Alcohol use: No  . Drug use: No  . Sexual activity: No    Birth control/protection: None  Other Topics Concern  . None  Social History Narrative  . None    Family History: Family History  Problem Relation  Age of Onset  . Hypertension Mother   . Diabetes Father   . Hypertension Father   . Diabetes Paternal Grandmother     Allergies: No Known Allergies  Medications Prior to Admission  Medication Sig Dispense Refill Last Dose  . amLODipine (NORVASC) 10 MG tablet Take 1 tablet (10 mg total) by mouth daily. (Patient not taking: Reported on 12/24/2016) 30 tablet 1 Unknown at Unknown time  . labetalol (NORMODYNE) 100 MG tablet Take 1 tablet (100 mg total) 2 (two) times daily by mouth. (Patient not taking: Reported on 04/01/2017) 60 tablet 0 Unknown at Unknown time  . Prenatal Vit-Fe Fumarate-FA (PRENATAL VITAMIN) 27-0.8 MG TABS Take 1 tablet every morning by mouth. (Patient not taking: Reported on 04/01/2017) 30 tablet 0 Unknown at Unknown time     Review of Systems  All systems reviewed and negative except as stated in HPI  Physical Exam Height 5\' 10"  (1.778 m), weight 109.4 kg (241 lb 4 oz), last menstrual period 12/16/2016, unknown if currently breastfeeding. General appearance: alert, oriented, NAD Lungs: normal respiratory effort Heart: regular rate Abdomen: soft, non-tender; gravid, FH appropriate for GA Extremities: No calf swelling or tenderness Presentation: cephalic confirmed with ultrasound  Fetal monitoring: FHR 150, moderate variability, +accel, -decel Uterine activity: irregular     Prenatal labs: ABO, Rh:  pending Antibody:  pending  Rubella:  pending  RPR:  pending   HBsAg:  pending   HIV:  pending  GC/Chlamydia: pending  GBS:    2-hr GTT: not obtained  Genetic screening:  Not obtained  Anatomy US: limited due to gestational age   Prenatal Transfer Tool  Maternal Diabetes: No Genetic Screening: not obtained  Maternal Ultrasounds/Referrals: limited due to late gestational age  Fetal Ultrasounds or other Referrals:  None Maternal Substance Abuse:  No Significant Maternal Medications:  None Significant Maternal Lab Results: labs pending   Results for orders  placed or performed during the hospital encounter of 04/01/17 (from the past 24 hour(s))  Protein / creatinine ratio, urine   Collection Time: 04/01/17  9:05 AM  Result Value Ref Range   Creatinine, Urine 192.00 mg/dL   Total Protein, Urine 35 mg/dL   Protein Creatinine Ratio 0.18 (H) 0.00 - 0.15 mg/mg[Cre]  Urinalysis, Routine w reflex microscopic   Collection Time: 04/01/17  9:09 AM  Result Value Ref Range   Color, Urine YELLOW YELLOW   APPearance CLOUDY (A) CLEAR   Specific Gravity, Urine 1.019 1.005 - 1.030   pH 6.0 5.0 - 8.0   Glucose, UA NEGATIVE NEGATIVE mg/dL   Hgb urine dipstick NEGATIVE NEGATIVE   Bilirubin Urine NEGATIVE NEGATIVE   Ketones, ur NEGATIVE NEGATIVE mg/dL   Protein, ur 30 (A) NEGATIVE mg/dL   Nitrite NEGATIVE NEGATIVE   Leukocytes, UA MODERATE (A) NEGATIVE   RBC / HPF NONE SEEN 0 - 5 RBC/hpf   WBC, UA TOO NUMEROUS TO COUNT 0 - 5 WBC/hpf   Bacteria, UA RARE (A) NONE SEEN   Squamous Epithelial / LPF TOO NUMEROUS TO COUNT (A) NONE SEEN   Mucus PRESENT   Pregnancy, urine POC   Collection Time: 04/01/17  9:26 AM  Result Value Ref Range   Preg Test, Ur POSITIVE (A) NEGATIVE  CBC   Collection Time: 04/01/17  9:59 AM  Result Value Ref Range   WBC 9.0 4.0 - 10.5 K/uL   RBC 3.13 (L) 3.87 - 5.11 MIL/uL   Hemoglobin 9.0 (L) 12.0 - 15.0 g/dL   HCT 96.027.6 (L) 45.436.0 - 09.846.0 %   MCV 88.2 78.0 - 100.0 fL   MCH 28.8 26.0 - 34.0 pg   MCHC 32.6 30.0 - 36.0 g/dL   RDW 11.914.4 14.711.5 - 82.915.5 %   Platelets 269 150 - 400 K/uL  Comprehensive metabolic panel   Collection Time: 04/01/17  9:59 AM  Result Value Ref Range   Sodium 134 (L) 135 - 145 mmol/L   Potassium 3.3 (L) 3.5 - 5.1 mmol/L   Chloride 104 101 - 111 mmol/L   CO2 21 (L) 22 - 32 mmol/L   Glucose, Bld 84 65 - 99 mg/dL   BUN 8 6 - 20 mg/dL   Creatinine, Ser 5.620.62 0.44 - 1.00 mg/dL   Calcium 8.3 (L) 8.9 - 10.3 mg/dL   Total Protein 7.5 6.5 - 8.1 g/dL   Albumin 2.7 (L) 3.5 - 5.0 g/dL   AST 23 15 - 41 U/L   ALT 19 14 -  54 U/L   Alkaline Phosphatase 86 38 - 126 U/L   Total Bilirubin 0.6 0.3 - 1.2 mg/dL   GFR calc non Af Amer >60 >60 mL/min   GFR calc Af Amer >60 >60 mL/min   Anion gap 9 5 - 15  hCG, quantitative, pregnancy   Collection Time: 04/01/17  9:59 AM  Result Value Ref Range   hCG, Beta Chain, Quant, S 5,091 (H) <5 mIU/mL  Patient Active Problem List   Diagnosis Date Noted  . Preeclampsia 04/01/2017  . NSVD (normal spontaneous vaginal delivery) 03/14/2016  . Hypertension complicating pregnancy 07/12/2014  . Gestational hypertension, antepartum 06/23/2014  . 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 Insufficient prenatal care in third trimester 06/02/2014    Assessment: ARITA SEVERTSON is a 27 y.o. [id number] at [redacted]w[redacted]d here for IOL due to preeclampsia with severe features   #Labor: will induce with buccal cytotec  #Preeclampsia: labs pending, BP 151/87  #Pain: Per patient request, patient would like epidural  #FWB: Category 1  #ID:  Labs pending  #MOF: bottle  #MOC:desires tubal but did not sign paperwork, would like Nexplanon while waiting  #Circ:  Outpatient   Oralia Manis, DO PGY-1 04/01/2017, 6:25 PM   CNM attestation:  I have seen and examined this patient; I agree with above documentation in the resident's note.   KISTA ROBB is a 27 y.o. [phone] here for IOL due to gHTN/ possibly pre-e; reports a mild H/A presently  PE: BP (!) 151/86   Pulse 92   Temp 99.8 F (37.7 C) (Oral)   Resp 16   Ht 5\' 10"  (1.778 m)   Wt 109.4 kg (241 lb 4 oz)   LMP 12/16/2016 (Within Days)   BMI 34.62 kg/m  Gen: calm comfortable, NAD Resp: normal effort, no distress Abd: gravid  ROS, labs, PMH reviewed  Plan: Admit to [facility] Will repeat labs (P/C ratio 0.18 this AM) OB panel pending Give Fioricet to see if H/A relieved Potential for mag if H/A persists or BPs are severe range Induce with buccal cyto to start, then Pit prn Anticipate SVD  Cam Hai CNM 04/01/2017, 8:27 PM

## 2017-04-01 NOTE — Progress Notes (Signed)
Patient ID: Whitney Todd, female   DOB: Sep 18, 1990, 27 y.o.   MRN: [id number] Whitney Todd is a 27 y.o. [id number] at 6455w4d [facility]  Subjective: Comfortable, Denies ha, visual changes, ruq/epigastric pain, n/v.  Headache completely resolved w/ fioricet  Objective: BP (!) 153/82   Pulse 87   Temp 98 F (36.7 C) (Oral)   Resp 16   Ht 5\' 10"  (1.778 m)   Wt 109.4 kg (241 lb 4 oz)   LMP 12/16/2016 (Within Days)   BMI 34.62 kg/m  No intake/output data recorded.  FHT:  FHR: 135 bpm, variability: moderate,  accelerations:  Present,  decelerations:  Absent UC:   irregular  SVE:   2/th/-2, vtx Cervical foley bulb inserted and inflated w/ 60ml LR w/o difficulty   Labs: Lab Results  Component Value Date   WBC 9.2 04/01/2017   HGB 9.0 (L) 04/01/2017   HCT 27.7 (L) 04/01/2017   MCV 88.5 04/01/2017   PLT 296 04/01/2017    Assessment / Plan: IOL d/t GHTN, foley bulb now in, s/p cytotec @ 2036  Labor: cervical ripening Fetal Wellbeing:  Category I Pain Control:  n/a Pre-eclampsia: labs normal, bp's stable, asymptomatic I/D:  PCN for GBS+ Anticipated MOD:  NSVD  Cheral MarkerKimberly R Jerri Hargadon CNM, WHNP-BC 04/01/2017, 9:41 PM

## 2017-04-02 ENCOUNTER — Inpatient Hospital Stay (HOSPITAL_COMMUNITY): Payer: Medicaid Other | Admitting: Anesthesiology

## 2017-04-02 ENCOUNTER — Encounter (HOSPITAL_COMMUNITY): Payer: Self-pay | Admitting: *Deleted

## 2017-04-02 DIAGNOSIS — Z3A37 37 weeks gestation of pregnancy: Secondary | ICD-10-CM

## 2017-04-02 DIAGNOSIS — O1414 Severe pre-eclampsia complicating childbirth: Secondary | ICD-10-CM

## 2017-04-02 LAB — HEPATITIS B SURFACE ANTIGEN: HEP B S AG: NEGATIVE

## 2017-04-02 LAB — CBC
HCT: 27.3 % — ABNORMAL LOW (ref 36.0–46.0)
Hemoglobin: 9 g/dL — ABNORMAL LOW (ref 12.0–15.0)
MCH: 29.1 pg (ref 26.0–34.0)
MCHC: 33 g/dL (ref 30.0–36.0)
MCV: 88.3 fL (ref 78.0–100.0)
PLATELETS: 276 10*3/uL (ref 150–400)
RBC: 3.09 MIL/uL — ABNORMAL LOW (ref 3.87–5.11)
RDW: 14.4 % (ref 11.5–15.5)
WBC: 12.5 10*3/uL — ABNORMAL HIGH (ref 4.0–10.5)

## 2017-04-02 LAB — GC/CHLAMYDIA PROBE AMP (~~LOC~~) NOT AT ARMC
Chlamydia: POSITIVE — AB
Neisseria Gonorrhea: NEGATIVE

## 2017-04-02 LAB — RUBELLA SCREEN: Rubella: 1.26 index (ref >0.99)

## 2017-04-02 LAB — RPR: RPR Ser Ql: NONREACTIVE

## 2017-04-02 MED ORDER — PHENYLEPHRINE 40 MCG/ML (10ML) SYRINGE FOR IV PUSH (FOR BLOOD PRESSURE SUPPORT)
80.0000 ug | PREFILLED_SYRINGE | INTRAVENOUS | Status: DC | PRN
  Filled 2017-04-02: qty 5

## 2017-04-02 MED ORDER — DIBUCAINE 1 % RE OINT: 1.0000 "application " | TOPICAL_OINTMENT | RECTAL | Status: DC | PRN

## 2017-04-02 MED ORDER — OXYCODONE-ACETAMINOPHEN 5-325 MG PO TABS
1.0000 | ORAL_TABLET | Freq: Four times a day (QID) | ORAL | Status: DC | PRN
  Administered 2017-04-02 – 2017-04-04 (×5): 1 via ORAL
  Filled 2017-04-02 (×5): qty 1

## 2017-04-02 MED ORDER — ACETAMINOPHEN 325 MG PO TABS: 650.0000 mg | ORAL_TABLET | ORAL | Status: DC | PRN

## 2017-04-02 MED ORDER — BENZOCAINE-MENTHOL 20-0.5 % EX AERO
1.0000 "application " | INHALATION_SPRAY | CUTANEOUS | Status: DC | PRN
  Administered 2017-04-02: 1 via TOPICAL
  Filled 2017-04-02: qty 56

## 2017-04-02 MED ORDER — PRENATAL MULTIVITAMIN CH
1.0000 | ORAL_TABLET | Freq: Every day | ORAL | Status: DC
  Administered 2017-04-02 – 2017-04-03 (×2): 1 via ORAL
  Filled 2017-04-02 (×2): qty 1

## 2017-04-02 MED ORDER — SODIUM CHLORIDE 0.9 % IV SOLN: 250.0000 mL | INTRAVENOUS | Status: DC | PRN

## 2017-04-02 MED ORDER — MEASLES, MUMPS & RUBELLA VAC ~~LOC~~ INJ: 0.5000 mL | INJECTION | Freq: Once | SUBCUTANEOUS | Status: DC

## 2017-04-02 MED ORDER — BISACODYL 10 MG RE SUPP
10.0000 mg | Freq: Every day | RECTAL | Status: DC | PRN
Start: 2017-04-02 — End: 2017-04-04

## 2017-04-02 MED ORDER — SENNOSIDES-DOCUSATE SODIUM 8.6-50 MG PO TABS
2.0000 | ORAL_TABLET | ORAL | Status: DC
  Administered 2017-04-02 – 2017-04-04 (×2): 2 via ORAL
  Filled 2017-04-02 (×2): qty 2

## 2017-04-02 MED ORDER — TETANUS-DIPHTH-ACELL PERTUSSIS 5-2.5-18.5 LF-MCG/0.5 IM SUSP: 0.5000 mL | Freq: Once | INTRAMUSCULAR | Status: DC

## 2017-04-02 MED ORDER — ZOLPIDEM TARTRATE 5 MG PO TABS: 5.0000 mg | ORAL_TABLET | Freq: Every evening | ORAL | Status: DC | PRN

## 2017-04-02 MED ORDER — EPHEDRINE 5 MG/ML INJ
10.0000 mg | INTRAVENOUS | Status: DC | PRN
  Filled 2017-04-02: qty 2

## 2017-04-02 MED ORDER — ONDANSETRON HCL 4 MG PO TABS: 4.0000 mg | ORAL_TABLET | ORAL | Status: DC | PRN

## 2017-04-02 MED ORDER — SODIUM CHLORIDE 0.9% FLUSH: 3.0000 mL | INTRAVENOUS | Status: DC | PRN

## 2017-04-02 MED ORDER — MAGNESIUM SULFATE 40 G IN LACTATED RINGERS - SIMPLE
2.0000 g/h | INTRAVENOUS | Status: AC
  Administered 2017-04-02: 2 g/h via INTRAVENOUS
  Filled 2017-04-02: qty 40

## 2017-04-02 MED ORDER — LACTATED RINGERS IV SOLN
500.0000 mL | Freq: Once | INTRAVENOUS | Status: AC
  Administered 2017-04-02: 500 mL via INTRAVENOUS

## 2017-04-02 MED ORDER — SODIUM CHLORIDE 0.9% FLUSH
3.0000 mL | Freq: Two times a day (BID) | INTRAVENOUS | Status: DC
  Administered 2017-04-03: 3 mL via INTRAVENOUS

## 2017-04-02 MED ORDER — ONDANSETRON HCL 4 MG/2ML IJ SOLN: 4.0000 mg | INTRAMUSCULAR | Status: DC | PRN

## 2017-04-02 MED ORDER — WITCH HAZEL-GLYCERIN EX PADS: 1.0000 "application " | MEDICATED_PAD | CUTANEOUS | Status: DC | PRN

## 2017-04-02 MED ORDER — SIMETHICONE 80 MG PO CHEW: 80.0000 mg | CHEWABLE_TABLET | ORAL | Status: DC | PRN

## 2017-04-02 MED ORDER — LACTATED RINGERS IV SOLN
INTRAVENOUS | Status: DC
Start: 2017-04-02 — End: 2017-04-04
  Administered 2017-04-02: 17:00:00 via INTRAVENOUS

## 2017-04-02 MED ORDER — DIPHENHYDRAMINE HCL 50 MG/ML IJ SOLN: 12.5000 mg | INTRAMUSCULAR | Status: DC | PRN

## 2017-04-02 MED ORDER — AMLODIPINE BESYLATE 10 MG PO TABS
10.0000 mg | ORAL_TABLET | Freq: Every day | ORAL | Status: DC
  Administered 2017-04-02 – 2017-04-04 (×3): 10 mg via ORAL
  Filled 2017-04-02 (×3): qty 1

## 2017-04-02 MED ORDER — FENTANYL 2.5 MCG/ML BUPIVACAINE 1/10 % EPIDURAL INFUSION (WH - ANES): 14.0000 mL/h | INTRAMUSCULAR | Status: DC | PRN

## 2017-04-02 MED ORDER — FLEET ENEMA 7-19 GM/118ML RE ENEM: 1.0000 | ENEMA | Freq: Every day | RECTAL | Status: DC | PRN

## 2017-04-02 MED ORDER — IBUPROFEN 600 MG PO TABS
600.0000 mg | ORAL_TABLET | Freq: Four times a day (QID) | ORAL | Status: DC
  Administered 2017-04-02 – 2017-04-04 (×8): 600 mg via ORAL
  Filled 2017-04-02 (×8): qty 1

## 2017-04-02 MED ORDER — COCONUT OIL OIL: 1.0000 "application " | TOPICAL_OIL | Status: DC | PRN

## 2017-04-02 MED ORDER — DIPHENHYDRAMINE HCL 25 MG PO CAPS: 25.0000 mg | ORAL_CAPSULE | Freq: Four times a day (QID) | ORAL | Status: DC | PRN

## 2017-04-02 NOTE — Progress Notes (Signed)
Patient ID: Whitney Todd, female   DOB: May 12, 1990, 27 y.o.   MRN: [id number] Whitney Todd is a 27 y.o. [id number] at 3859w5d admitted for induction of labor due to severe pre-e.  Subjective: Uncomfortable w/ uc's, has gotten 3 doses of iv fentanyl. Denies ha, visual changes, ruq/epigastric pain, n/v.    Objective: BP (!) 154/100   Pulse 97   Temp 97.9 F (36.6 C) (Oral)   Resp 16   Ht 5\' 10"  (1.778 m)   Wt 109.4 kg (241 lb 4 oz)   LMP 12/16/2016 (Within Days)   BMI 34.62 kg/m  Total I/O In: 1034.6 [P.O.:120; I.V.:914.6] Out: 700 [Urine:700]  FHT:  FHR: 130 bpm, variability: moderate,  accelerations:  Present,  decelerations:  Absent UC:   regular, every 2-4 minutes  SVE:   Dilation: 6.5 Effacement (%): 80 Station: -2 Exam by:: Whitney Lore Sullivan RN  Labs: Lab Results  Component Value Date   WBC 12.5 (H) 04/02/2017   HGB 9.0 (L) 04/02/2017   HCT 27.3 (L) 04/02/2017   MCV 88.3 04/02/2017   PLT 276 04/02/2017    Assessment / Plan: IOL d/t severe pre-e, s/p cytotec x 1 and foley bulb, progressing on her own w/o augmentation  Labor: Progressing normally Fetal Wellbeing:  Category I Pain Control:  IV pain meds Pre-eclampsia: stable, on mag I/D:  pcn for gbs+ Anticipated MOD:  NSVD  Whitney Todd CNM, WHNP-BC 04/02/2017, 4:49 AM

## 2017-04-02 NOTE — Progress Notes (Signed)
Patient ID: Whitney Todd, female   DOB: 13-Dec-1990, 27 y.o.   MRN: [id number] Whitney Todd is a 27 y.o. [id number] at 7436w5d admitted for induction of labor due to severe pre-e.  Subjective: Uncomfortable w/ uc's. Denies ha, visual changes, ruq/epigastric pain, n/v.    Objective: BP (!) 153/97   Pulse 88   Temp 97.9 F (36.6 C) (Oral)   Resp 16   Ht 5\' 10"  (1.778 m)   Wt 109.4 kg (241 lb 4 oz)   LMP 12/16/2016 (Within Days)   BMI 34.62 kg/m  Total I/O In: 789.6 [I.V.:789.6] Out: 200 [Urine:200]  FHT:  FHR: 135 bpm, variability: moderate,  accelerations:  Present,  decelerations:  Absent UC:   regular, every 2-4 minutes  SVE:   Dilation: 4 Effacement (%): 50 Station: -2 Exam by:: C Lendell CapriceSullivan RN  Foley bulb now out  Labs: Lab Results  Component Value Date   WBC 9.2 04/01/2017   HGB 9.0 (L) 04/01/2017   HCT 27.7 (L) 04/01/2017   MCV 88.5 04/01/2017   PLT 296 04/01/2017    Assessment / Plan: IOL d/t severe pre-e, foley bulb now out, contracting on her own and becoming uncomfortable- expectant management for now, monitor for need for pit/arom. Has received 1 dose IV Labetalol for severe range bp, started on mag   Labor: s/p cervical ripening Fetal Wellbeing:  Category I Pain Control:  IV pain meds Pre-eclampsia: bp's stable, asymptomatic, labs normal I/D:  PCN for GBS+ Anticipated MOD:  NSVD  Cheral MarkerKimberly R Aksel Bencomo CNM, WHNP-BC 04/02/2017, 1:44 AM

## 2017-04-02 NOTE — Anesthesia Preprocedure Evaluation (Deleted)
Anesthesia Evaluation  Patient identified by MRN, date of birth, ID band Patient awake    Airway Mallampati: II       Dental no notable dental hx.    Pulmonary neg pulmonary ROS,    Pulmonary exam normal        Cardiovascular hypertension,  Rhythm:Regular     Neuro/Psych  Headaches,    GI/Hepatic   Endo/Other    Renal/GU      Musculoskeletal   Abdominal   Peds  Hematology   Anesthesia Other Findings   Reproductive/Obstetrics (+) Pregnancy                             Lab Results  Component Value Date   WBC 12.5 (H) 04/02/2017   HGB 9.0 (L) 04/02/2017   HCT 27.3 (L) 04/02/2017   MCV 88.3 04/02/2017   PLT 276 04/02/2017    Anesthesia Physical Anesthesia Plan  ASA: III  Anesthesia Plan: Epidural   Post-op Pain Management:    Induction:   PONV Risk Score and Plan:   Airway Management Planned: Natural Airway  Additional Equipment:   Intra-op Plan:   Post-operative Plan:   Informed Consent: I have reviewed the patients History and Physical, chart, labs and discussed the procedure including the risks, benefits and alternatives for the proposed anesthesia with the patient or authorized representative who has indicated his/her understanding and acceptance.     Plan Discussed with: CRNA  Anesthesia Plan Comments: (Pt dellvred prior to procedure being performed. Epidural not done)       Anesthesia Quick Evaluation

## 2017-04-03 ENCOUNTER — Encounter (HOSPITAL_COMMUNITY): Payer: Self-pay

## 2017-04-03 ENCOUNTER — Other Ambulatory Visit: Payer: Self-pay | Admitting: Obstetrics and Gynecology

## 2017-04-03 MED ORDER — HYDROCHLOROTHIAZIDE 25 MG PO TABS
25.0000 mg | ORAL_TABLET | Freq: Every day | ORAL | Status: DC
  Administered 2017-04-03 – 2017-04-04 (×2): 25 mg via ORAL
  Filled 2017-04-03 (×2): qty 1

## 2017-04-03 MED ORDER — AZITHROMYCIN 250 MG PO TABS
1000.0000 mg | ORAL_TABLET | Freq: Once | ORAL | Status: AC
  Administered 2017-04-03: 1000 mg via ORAL
  Filled 2017-04-03: qty 4

## 2017-04-03 MED ORDER — LABETALOL HCL 5 MG/ML IV SOLN: 20.0000 mg | INTRAVENOUS | Status: DC | PRN

## 2017-04-03 MED ORDER — HYDRALAZINE HCL 20 MG/ML IJ SOLN: 10.0000 mg | Freq: Once | INTRAMUSCULAR | Status: DC | PRN

## 2017-04-03 NOTE — Progress Notes (Signed)
Post Partum Day 1 Subjective: Patient reports feeling well without headache, visual changes, RUQ/epigastric pain. She is ambulated, voiding and tolerating a regular diet  Objective: Blood pressure (!) 158/97, pulse 76, temperature 97.6 F (36.4 C), temperature source Oral, resp. rate 18, height 5\' 10"  (1.778 m), weight 226 lb 4 oz (102.6 kg), last menstrual period 12/16/2016, SpO2 100 %, unknown if currently breastfeeding.  Physical Exam:  General: alert, cooperative and no distress Lochia: appropriate Uterine Fundus: firm DVT Evaluation: No evidence of DVT seen on physical exam. Negative Homan's sign.  Recent Labs    04/01/17 1805 04/02/17 0420  HGB 9.0* 9.0*  HCT 27.7* 27.3*    Assessment/Plan: Patient completed 24 hours magnesium sulfate for seizure prophylaxis Continue monitoring BP Continue Norvasc 10 mg HCTZ 25 mg added to regimen Patient informed of positive chlamydia test result and need to inform partner. Azithromycin ordered Continue routine postpartum care Patient desires postpartum BTL- medicaid form to be signed today   LOS: 2 days   Whitney Todd 04/03/2017, 8:56 AM

## 2017-04-03 NOTE — Clinical Social Work Maternal (Signed)
CLINICAL SOCIAL WORK MATERNAL/CHILD NOTE  Patient Details  Name: Whitney Todd MRN: [id number] Date of Birth: 07/02/1990  Date:  04/03/2017  Clinical Social Worker Initiating Note:  Mechelle Pates Boyd-Gilyard Date/Time: Initiated:  04/03/17/1218     Child's Name:  Whitney Todd   Biological Parents:  Mother, Father(FOB is Whitney Todd 07/30/91)   Need for Interpreter:  None   Reason for Referral:  Late or No Prenatal Care    Address:  [address] [place] [address]    Phone number:  [phone] (home)     Additional phone number: MOB's mother, Whitney Todd [phone]  Household Members/Support Persons (HM/SP):   Household Member/Support Person 1, Household Member/Support Person 2, Household Member/Support Person 3, Household Member/Support Person 4, Household Member/Support Person 5   HM/SP Name Relationship DOB or Age  HM/SP -1 Whitney Todd  daughter 01/28/2013  HM/SP -2 Whitney Todd daughter 06/12/12  HM/SP -3 Whitney Todd son 07/13/14  HM/SP -4 Whitney Todd daughter 03/14/2016  HM/SP -5 Whitney Todd MOB's God Mother unknown  HM/SP -6        HM/SP -7        HM/SP -8          Natural Supports (not living in the home):  Parent, Immediate Family, Extended Family, Friends(Per MOB, FOB's family will also provide support. )   Professional Supports: None   Employment: Full-time   Type of Work: [employer] service   Education:      Homebound arranged:    Financial Resources:  Medicaid   Other Resources:  Food Stamps , WIC   Cultural/Religious Considerations Which May Impact Care:  None Reported  Strengths:  Ability to meet basic needs , Home prepared for child , Pediatrician chosen   Psychotropic Medications:         Pediatrician:    [place] area  Pediatrician List:   [facility] ([address])  [place]    [place]    [place]    [place]    [place]       Pediatrician Fax Number:    Risk Factors/Current Problems:  Substance Use    Cognitive State:  Alert , Able to Concentrate , Linear Thinking    Mood/Affect:  Apprehensive , Irritable , Comfortable , Flat    CSW Assessment: CSW met with MOB in room 319.  When CSW arrived, MOB was bonding with infant as evidence by holding and admiring him. CSW explained CSW's role, and MOB gave CSW permission to complete the assessment while MOB's sister (appearing to be middle school age young lady) was present. CSW offered to have MOB's sister to leave the room however, MOB was adamant about her sister staying. MOB appeared irritated and apprehensive about meeting with CSW as evidence by limited eye contact and short and concise responses to CSW's questions.   CSW asked about MOB's lack of PNC and MOB reported, "I did not know I was this far along pregnant.  I came into the hospital to see how far along I was because I was thinking about having an abortion."  MOB communicated that she was expecting to be due around June 2019. CSW processed MOB's thoughts and feelings about delivering and having a new baby.  MOB shared feelings of happiness and shared that MOB's family and friends will assist MOB with getting all needed items for infant; MOB reported having a bassinet and car seat.   CSW reviewed the hospital policy   regarding NPNC and MOB communicated that she was familiar with it.  MOB disclosed that MOB had to see a CSW with MOB's last baby for the same reason LPNC/NPNC. MOB acknowledged the use of marijuana during the pregnancy and reported her last use was about 1 month ago.  MOB restated, "I did not know I was pregnant." CSW made MOB aware of the infant's UDS results and shared with MOB that if infant's CDS is positive, CSW will make a report with [place] CPS.  MOB appeared familiar with the CPS process and admitted to a hx of CPS involvement.  MOB denied having a current open case and shared  that MOB's last open case was February 2018 for substance exposure of a newborn. CSW offered MOB resources for SA interventions and MOB declined.  MOB also declined resources for parenting.   CSW thanked MOB for meeting with CSW.   CSW Plan/Description:  No Further Intervention Required/No Barriers to Discharge, Sudden Infant Death Syndrome (SIDS) Education, Other Information/Referral to Community Resources, Perinatal Mood and Anxiety Disorder (PMADs) Education, Hospital Drug Screen Policy Information, CSW Will Continue to Monitor Umbilical Cord Tissue Drug Screen Results and Make Report if Warranted   Carnel Stegman Boyd-Gilyard, MSW, LCSW Clinical Social Work [phone]  Haruna Rohlfs D BOYD-GILYARD, LCSW 04/03/2017, 2:07 PM  

## 2017-04-04 MED ORDER — HYDROCHLOROTHIAZIDE 25 MG PO TABS: 25.0000 mg | ORAL_TABLET | Freq: Every day | ORAL | 0 refills | Status: AC

## 2017-04-04 MED ORDER — IBUPROFEN 600 MG PO TABS: 600.0000 mg | ORAL_TABLET | Freq: Four times a day (QID) | ORAL | 0 refills | Status: AC

## 2017-04-04 MED ORDER — AMLODIPINE BESYLATE 10 MG PO TABS: 10.0000 mg | ORAL_TABLET | Freq: Every day | ORAL | 0 refills | Status: AC

## 2017-04-04 NOTE — Discharge Instructions (Signed)
Places to have your son circumcised:    [facility] [phone] while you are in hospital  [facility] [phone] $244 by 4 wks  [facility] [phone] $175 by 2 wks  [facility] [phone] $250 by 7 days [facility] [phone] $269 by 4 wks  These prices sometimes change but are roughly what you can expect to pay. Please call and confirm pricing.   Circumcision is considered an elective/non-medically necessary procedure. There are many reasons parents decide to have their sons circumsized. During the first year of life circumcised males have a reduced risk of urinary tract infections but after this year the rates between circumcised males and uncircumcised males are the same.  It is safe to have your son circumcised outside of the hospital and the places above perform them regularly.   Deciding about Circumcision in Baby Boys  (Up-to-date The Basics)  What is circumcision?  Circumcision is a surgery that removes the skin that covers the tip of the penis, called the "foreskin" Circumcision is usually done when a boy is between 12 and 71 days old. In the [place], circumcision is common. In some other countries, fewer boys are circumcised. Circumcision is a common tradition in some religions.  Should I have my baby boy circumcised?  There is no easy answer. Circumcision has some benefits. But it also has risks. After talking with your doctor, you will have to decide for yourself what is right for your family.  What are the benefits of circumcision?  Circumcised boys seem to have slightly lower rates of: ?Urinary tract infections ?Swelling of the opening at the tip of the penis Circumcised men seem to have slightly lower rates of: ?Urinary tract infections ?Swelling of the opening at the tip of the penis ?Penis  cancer ?HIV and other infections that you catch during sex ?Cervical cancer in the women they have sex with Even so, in the [place], the risks of these problems are small - even in boys and men who have not been circumcised. Plus, boys and men who are not circumcised can reduce these extra risks by: ?Cleaning their penis well ?Using condoms during sex  What are the risks of circumcision?  Risks include: ?Bleeding or infection from the surgery ?Damage to or amputation of the penis ?A chance that the doctor will cut off too much or not enough of the foreskin ?A chance that sex won't feel as good later in life Only about 1 out of every 200 circumcisions leads to problems. There is also a chance that your health insurance won't pay for circumcision.  How is circumcision done in baby boys?  First, the baby gets medicine for pain relief. This might be a cream on the skin or a shot into the base of the penis. Next, the doctor cleans the baby's penis well. Then he or she uses special tools to cut off the foreskin. Finally, the doctor wraps a bandage (called gauze) around the baby's penis. If you have your baby circumcised, his doctor or nurse will give you instructions on how to care for him after the surgery. It is important that you follow those instructions carefully.    Postpartum Care After Vaginal Delivery The period of time right after you deliver your newborn is called the postpartum period. What kind of medical care will I receive?  You may continue to receive fluids and medicines through an IV tube inserted into one of your veins.  If an incision was made near your vagina (episiotomy)  or if you had some vaginal tearing during delivery, cold compresses may be placed on your episiotomy or your tear. This helps to reduce pain and swelling.  You may be given a squirt bottle to use when you go to the bathroom. You may use this until you are comfortable wiping as usual. To use the  squirt bottle, follow these steps: ? Before you urinate, fill the squirt bottle with warm water. Do not use hot water. ? After you urinate, while you are sitting on the toilet, use the squirt bottle to rinse the area around your urethra and vaginal opening. This rinses away any urine and blood. ? You may do this instead of wiping. As you start healing, you may use the squirt bottle before wiping yourself. Make sure to wipe gently. ? Fill the squirt bottle with clean water every time you use the bathroom.  You will be given sanitary pads to wear. How can I expect to feel?  You may not feel the need to urinate for several hours after delivery.  You will have some soreness and pain in your abdomen and vagina.  If you are breastfeeding, you may have uterine contractions every time you breastfeed for up to several weeks postpartum. Uterine contractions help your uterus return to its normal size.  It is normal to have vaginal bleeding (lochia) after delivery. The amount and appearance of lochia is often similar to a menstrual period in the first week after delivery. It will gradually decrease over the next few weeks to a dry, yellow-brown discharge. For most women, lochia stops completely by 6-8 weeks after delivery. Vaginal bleeding can vary from woman to woman.  Within the first few days after delivery, you may have breast engorgement. This is when your breasts feel heavy, full, and uncomfortable. Your breasts may also throb and feel hard, tightly stretched, warm, and tender. After this occurs, you may have milk leaking from your breasts.Your health care provider can help you relieve discomfort due to breast engorgement. Breast engorgement should go away within a few days.  You may feel more sad or worried than normal due to hormonal changes after delivery. These feelings should not last more than a few days. If these feelings do not go away after several days, speak with your health care  provider. How should I care for myself?  Tell your health care provider if you have pain or discomfort.  Drink enough water to keep your urine clear or pale yellow.  Wash your hands thoroughly with soap and water for at least 20 seconds after changing your sanitary pads, after using the toilet, and before holding or feeding your baby.  If you are not breastfeeding, avoid touching your breasts a lot. Doing this can make your breasts produce more milk.  If you become weak or lightheaded, or you feel like you might faint, ask for help before: ? Getting out of bed. ? Showering.  Change your sanitary pads frequently. Watch for any changes in your flow, such as a sudden increase in volume, a change in color, the passing of large blood clots. If you pass a blood clot from your vagina, save it to show to your health care provider. Do not flush blood clots down the toilet without having your health care provider look at them.  Make sure that all your vaccinations are up to date. This can help protect you and your baby from getting certain diseases. You may need to have immunizations done before you  leave the hospital.  If desired, talk with your health care provider about methods of family planning or birth control (contraception). How can I start bonding with my baby? Spending as much time as possible with your baby is very important. During this time, you and your baby can get to know each other and develop a bond. Having your baby stay with you in your room (rooming in) can give you time to get to know your baby. Rooming in can also help you become comfortable caring for your baby. Breastfeeding can also help you bond with your baby. How can I plan for returning home with my baby?  Make sure that you have a car seat installed in your vehicle. ? Your car seat should be checked by a certified car seat installer to make sure that it is installed safely. ? Make sure that your baby fits into the car  seat safely.  Ask your health care provider any questions you have about caring for yourself or your baby. Make sure that you are able to contact your health care provider with any questions after leaving the hospital. This information is not intended to replace advice given to you by your health care provider. Make sure you discuss any questions you have with your health care provider. Document Released: 12/02/2006 Document Revised: 07/10/2015 Document Reviewed: 01/09/2015 Elsevier Interactive Patient Education  [employer].

## 2017-04-04 NOTE — Progress Notes (Signed)
Discharge instructions reviewed with patient, pre-eclampsia s/s reviewed, Baby and Me book given and reviewed.  Follow-up appoint scheduled and pt to have BP checked at office on Friday. Baby discharge info reviewed with pt, SIDS reviewed and PP depression scored completed.

## 2017-04-04 NOTE — Discharge Summary (Signed)
OB Discharge Summary     Patient Name: Whitney KraussVontasia J Niu DOB: 31-Aug-1990 MRN: [id number]  Date of admission: 04/01/2017 Delivering MD: Levie HeritageSTINSON, JACOB J   Date of discharge: 04/04/2017  Admitting diagnosis: 37wks induction Intrauterine pregnancy: 8447w5d     Secondary diagnosis:  Active Problems:   Preeclampsia     Discharge diagnosis: Term Pregnancy Delivered and Preeclampsia (severe)                                                                                                Hospital course:   Patient presented to the hospital with complaints of headache and facial swelling. BP noted to be severely elevated. Patient admitted for induction of labor due to severe preeemclampsia. Patient did not receive prenatal care for this pregnancy and reported that she was unaware of the pregnancy. Ob ultrasound obtained revealed a 37 week fetus. Patient had an uncomplicated vaginal birth. She received magnesium sulfate for 24 hours for seizure prophylaxis. She was started on Norvasc 10 mg and HCTZ 25 mg. Her blood pressure improved during her hospitalization. She ambulated, tolerated a regular diet and voided without difficulty. Patient was found stable for discharge on PPD#2. She will follow up in the office for BP check next week and desire depo-provera at that time. She is scheduled for a laparoscopic bilateral tubal ligation on 3/27. Patient was treated for chlamydia inpatient and advised to inform her partner. Patient is formula feeding  Induction of Labor With Vaginal Delivery   27 y.o. yo G5P4005 at 7747w5d was admitted to the hospital 04/01/2017 for induction of labor.  Indication for induction: Preeclampsia.  Patient had an uncomplicated labor course as follows: Membrane Rupture Time/Date: 5:10 AM ,04/02/2017   Intrapartum Procedures: Episiotomy: None [1]                                         Lacerations:  1st degree [2]  Patient had delivery of a Viable infant.  Information for the  patient's newborn:  Cathey EndowRutledge, Boy Ruthia [[id number]][[id number]]  Delivery Method: Vag-Spont   04/02/2017  Details of delivery can be found in separate delivery note.  Patient had a routine postpartum course. Patient is discharged home 04/04/17.  Physical exam  Vitals:   04/03/17 2336 04/04/17 0438 04/04/17 0812 04/04/17 0845  BP: (!) 152/86 (!) 155/82 (!) 149/101 (!) 155/103  Pulse: 97 84 83   Resp: 18 18 18    Temp: 98 F (36.7 C) 98 F (36.7 C) 98.2 F (36.8 C)   TempSrc: Oral Oral Oral   SpO2: 98%  100%   Weight:  221 lb 4 oz (100.4 kg)    Height:       General: alert, cooperative and no distress Lochia: appropriate Uterine Fundus: firm DVT Evaluation: No evidence of DVT seen on physical exam. Negative Homan's sign. Labs: Lab Results  Component Value Date   WBC 12.5 (H) 04/02/2017   HGB 9.0 (L) 04/02/2017   HCT 27.3 (L) 04/02/2017   MCV 88.3 04/02/2017  PLT 276 04/02/2017   CMP Latest Ref Rng & Units 04/01/2017  Glucose 65 - 99 mg/dL 82  BUN 6 - 20 mg/dL 9  Creatinine 1.61 - 0.96 mg/dL 0.45  Sodium 409 - 811 mmol/L 134(L)  Potassium 3.5 - 5.1 mmol/L 3.7  Chloride 101 - 111 mmol/L 104  CO2 22 - 32 mmol/L 23  Calcium 8.9 - 10.3 mg/dL 9.1(Y)  Total Protein 6.5 - 8.1 g/dL 7.2  Total Bilirubin 0.3 - 1.2 mg/dL 0.5  Alkaline Phos 38 - 126 U/L 90  AST 15 - 41 U/L 26  ALT 14 - 54 U/L 18    Discharge instruction: per After Visit Summary and "Baby and Me Booklet".  After visit meds:  Allergies as of 04/04/2017   No Known Allergies     Medication List    STOP taking these medications   labetalol 100 MG tablet Commonly known as:  NORMODYNE     TAKE these medications   acetaminophen 500 MG tablet Commonly known as:  TYLENOL Take 1,000 mg by mouth every 6 (six) hours as needed for moderate pain.   amLODipine 10 MG tablet Commonly known as:  NORVASC Take 1 tablet (10 mg total) by mouth daily.   hydrochlorothiazide 25 MG tablet Commonly known as:  HYDRODIURIL Take 1  tablet (25 mg total) by mouth daily.   ibuprofen 600 MG tablet Commonly known as:  ADVIL,MOTRIN Take 1 tablet (600 mg total) by mouth every 6 (six) hours.   Prenatal Vitamin 27-0.8 MG Tabs Take 1 tablet every morning by mouth.       Diet: routine diet  Activity: Advance as tolerated. Pelvic rest for 6 weeks.   Follow up Appt: Future Appointments  Date Time Provider Department Center  05/14/2017  9:15 AM Marny Lowenstein, PA-C [facility]   Follow up Visit:No Follow-up on file.  Postpartum contraception: Depo Provera  Newborn Data: Live born female  Birth Weight: 7 lb 3.9 oz (3285 g) APGAR: 8, 9  Newborn Delivery   Birth date/time:  04/02/2017 05:16:00 Delivery type:  Vaginal, Spontaneous     Baby Feeding: Bottle Disposition:home with mother   04/04/2017 Catalina Antigua, MD

## 2017-04-11 ENCOUNTER — Telehealth: Payer: Self-pay | Admitting: *Deleted

## 2017-04-11 ENCOUNTER — Ambulatory Visit: Payer: Medicaid Other

## 2017-04-11 ENCOUNTER — Encounter: Payer: Self-pay | Admitting: *Deleted

## 2017-04-11 NOTE — Telephone Encounter (Signed)
Whitney Todd did not keep her scheduled appointment for follow up blood pressure. Per chart review had severe pre-eclampsia. I called Whitney Todd's home/mobile number and heard message this is not a working number. I called her contact number and left  A message I am [facility]- please tell her we are trying to reach her and is important to call us to reschedule her missed appointment. Will also send certified letter.

## 2017-04-16 ENCOUNTER — Telehealth: Payer: Self-pay | Admitting: General Practice

## 2017-04-16 NOTE — Telephone Encounter (Signed)
Whitney [facility]ondra [facility] called and left message on our nurse line stating she went out to check on the patient yesterday and her BP was elevated at 140/95. Patient doesn't report being symptomatic. Patient is taking her BP medication. Per chart review, patient no showed to recent BP check visit.  Called patient and discussed recently missed BP check appt & need to come in. Patient verbalized understanding & states she can come Friday 3/1 @ 3pm. Patient had no questions

## 2017-04-18 ENCOUNTER — Ambulatory Visit: Payer: Medicaid Other

## 2017-04-22 NOTE — Progress Notes (Signed)
CSW made a CPS report with [place] CPS intake worker, Pam Miller, for infant's positive CDS for THC. Peds office was also notified of positive CDS.  Lezli Danek Boyd-Gilyard, MSW, LCSW Clinical Social Work [phone] 

## 2017-04-28 ENCOUNTER — Other Ambulatory Visit: Payer: Self-pay | Admitting: Obstetrics and Gynecology

## 2017-05-07 ENCOUNTER — Ambulatory Visit: Payer: Medicaid Other | Admitting: Family Medicine

## 2017-05-12 ENCOUNTER — Encounter (HOSPITAL_BASED_OUTPATIENT_CLINIC_OR_DEPARTMENT_OTHER): Payer: Self-pay | Admitting: *Deleted

## 2017-05-12 ENCOUNTER — Other Ambulatory Visit: Payer: Self-pay

## 2017-05-12 NOTE — Pre-Procedure Instructions (Signed)
To come for labs and EKG 05/13/2017; bring meds.

## 2017-05-14 ENCOUNTER — Ambulatory Visit (HOSPITAL_BASED_OUTPATIENT_CLINIC_OR_DEPARTMENT_OTHER)
Admission: RE | Admit: 2017-05-14 | Payer: Medicaid Other | Source: Ambulatory Visit | Admitting: Obstetrics and Gynecology

## 2017-05-14 ENCOUNTER — Ambulatory Visit: Payer: Medicaid Other | Admitting: Medical

## 2017-05-14 HISTORY — DX: Headache, unspecified: R51.9

## 2017-05-14 HISTORY — DX: Essential (primary) hypertension: I10

## 2017-05-14 HISTORY — DX: Headache: R51

## 2017-05-14 SURGERY — LIGATION, FALLOPIAN TUBE, LAPAROSCOPIC
Anesthesia: Choice | Laterality: Bilateral

## 2017-05-14 NOTE — H&P (Signed)
Patient did not come in for scheduled bilateral tubal ligation. Patient to contact office and reschedule if still desired  ROS Physical Exam

## 2017-05-15 DIAGNOSIS — Z029 Encounter for administrative examinations, unspecified: Secondary | ICD-10-CM

## 2018-02-01 ENCOUNTER — Emergency Department (HOSPITAL_COMMUNITY)
Admission: EM | Admit: 2018-02-01 | Discharge: 2018-02-01 | Disposition: A | Discharge status: Home / Self Care | Payer: Medicaid Other | Attending: Emergency Medicine | Admitting: Emergency Medicine

## 2018-02-01 ENCOUNTER — Other Ambulatory Visit: Payer: Self-pay

## 2018-02-01 ENCOUNTER — Emergency Department (HOSPITAL_COMMUNITY): Payer: Medicaid Other

## 2018-02-01 ENCOUNTER — Encounter (HOSPITAL_COMMUNITY): Payer: Self-pay | Admitting: *Deleted

## 2018-02-01 DIAGNOSIS — Z79899 Other long term (current) drug therapy: Secondary | ICD-10-CM | POA: Diagnosis not present

## 2018-02-01 DIAGNOSIS — B9789 Other viral agents as the cause of diseases classified elsewhere: Secondary | ICD-10-CM | POA: Insufficient documentation

## 2018-02-01 DIAGNOSIS — I10 Essential (primary) hypertension: Secondary | ICD-10-CM | POA: Diagnosis not present

## 2018-02-01 DIAGNOSIS — R05 Cough: Secondary | ICD-10-CM | POA: Diagnosis present

## 2018-02-01 DIAGNOSIS — J069 Acute upper respiratory infection, unspecified: Secondary | ICD-10-CM | POA: Diagnosis not present

## 2018-02-01 MED ORDER — BENZONATATE 100 MG PO CAPS: 100.0000 mg | ORAL_CAPSULE | Freq: Three times a day (TID) | ORAL | 0 refills | Status: AC

## 2018-02-01 NOTE — Discharge Instructions (Signed)
Your chest xray today was  normal.You are likely suffering from a viral upper respiratory infection. I have prescribed tessalon pearls to help suppress your cough.Please purchase an expectorant like Mucinex to help loosen and thin the mucus production.You may also try some over the counter Robitussin to help with your cough. Continue to hydrate with plenty of fluids and Gatorade.If you experience any shortness of breath, chest pain or fever please return to the ED for reevaluation.

## 2018-02-01 NOTE — ED Provider Notes (Signed)
[facility]   CSN: [id number] Arrival date & time: 02/01/18  1808     History   Chief Complaint Chief Complaint  Patient presents with  . Cough    HPI Whitney Todd is a 27 y.o. female.  27 y.o female with no PMH presents to the ED with a chief complaint of cough, rhinorrhea, shortness of breath x 1 week. Patient reports her cough is productive with a dark green to light green sputum.  Has tried over-the-counter Alka-Seltzer cold and flu but reports no relieving symptoms.  She reports the cough is worse when she lays flat and at night and does not allow her to get rest.  She also reports a fever with a T-max of 110, asked about this patient reiterated that she meant 100.1 T-max.  He also reports some chest congestion along with chest pain which is worse with coughing.  He does also reports some rhinorrhea along with some shortness of breath.  Denies any abdominal pain, nausea, vomiting or other complaints.     Past Medical History:  Diagnosis Date  . Headache    1-2 x/week  . Hypertension    states under control with med., has been on med. x 1 month    Patient Active Problem List   Diagnosis Date Noted  . Preeclampsia 04/01/2017  . NSVD (normal spontaneous vaginal delivery) 03/14/2016  . Hypertension complicating pregnancy 07/12/2014  . Gestational hypertension, antepartum 06/23/2014  . 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 Insufficient prenatal care in third trimester 06/02/2014    Past Surgical History:  Procedure Laterality Date  . TOOTH EXTRACTION       OB History    Gravida  5   Para  5   Term  4   Preterm  0   AB  0   Living  5     SAB  0   TAB  0   Ectopic  0   Multiple  0   Live Births  5            Home Medications    Prior to Admission medications   Medication Sig Start Date End Date Taking? Authorizing Provider  acetaminophen (TYLENOL) 500 MG tablet Take  1,000 mg by mouth every 6 (six) hours as needed for moderate pain.    [provider]  amLODipine (NORVASC) 10 MG tablet Take 1 tablet (10 mg total) by mouth daily. 04/04/17   Constant, Peggy, MD  benzonatate (TESSALON) 100 MG capsule Take 1 capsule (100 mg total) by mouth every 8 (eight) hours for 7 days. 02/01/18 02/08/18  Claude MangesSoto, Saraiyah Hemminger, PA-C  hydrochlorothiazide (HYDRODIURIL) 25 MG tablet Take 1 tablet (25 mg total) by mouth daily. 04/04/17   Constant, Peggy, MD  ibuprofen (ADVIL,MOTRIN) 600 MG tablet Take 1 tablet (600 mg total) by mouth every 6 (six) hours. 04/04/17   Constant, Peggy, MD    Family History Family History  Problem Relation Age of Onset  . Hypertension Mother   . Diabetes Father   . Hypertension Father   . Diabetes Paternal Grandmother     Social History Social History   Tobacco Use  . Smoking status: Never Smoker  . Smokeless tobacco: Never Used  Substance Use Topics  . Alcohol use: Yes    Comment: occasionally  . Drug use: Yes    Types: Marijuana    Comment: last used 1 month ago     Allergies   Patient  has no known allergies.   Review of Systems Review of Systems  Constitutional: Negative for fever.  Respiratory: Positive for chest tightness and shortness of breath. Negative for wheezing.   Cardiovascular: Negative for chest pain.  Gastrointestinal: Negative for abdominal pain.     Physical Exam Updated Vital Signs BP (!) 175/125 (BP Location: Right Arm)   Pulse 93   Temp 97.8 F (36.6 C) (Oral)   Resp 16   Ht 5\' 10"  (1.778 m)   Wt 99.8 kg   LMP 01/31/2018   SpO2 98%   BMI 31.57 kg/m   Physical Exam Vitals signs and nursing note reviewed.  Constitutional:      General: She is not in acute distress.    Appearance: Normal appearance. She is well-developed.  HENT:     Head: Normocephalic and atraumatic.     Nose: Congestion and rhinorrhea present.     Right Sinus: Frontal sinus tenderness present. No maxillary sinus tenderness.      Left Sinus: Frontal sinus tenderness present. No maxillary sinus tenderness.     Mouth/Throat:     Lips: Pink.     Mouth: Mucous membranes are moist.     Tongue: No lesions.     Pharynx: Oropharynx is clear. Posterior oropharyngeal erythema present. No oropharyngeal exudate or uvula swelling.     Tonsils: No tonsillar exudate or tonsillar abscesses.  Eyes:     Pupils: Pupils are equal, round, and reactive to light.  Neck:     Musculoskeletal: Normal range of motion.  Cardiovascular:     Rate and Rhythm: Normal rate and regular rhythm.     Heart sounds: Normal heart sounds.  Pulmonary:     Effort: Pulmonary effort is normal. No respiratory distress.     Breath sounds: Examination of the right-middle field reveals decreased breath sounds. Examination of the left-lower field reveals decreased breath sounds. Decreased breath sounds present. No wheezing.  Abdominal:     General: Bowel sounds are normal. There is no distension.     Palpations: Abdomen is soft.     Tenderness: There is no abdominal tenderness.  Musculoskeletal:        General: No tenderness or deformity.     Right lower leg: No edema.     Left lower leg: No edema.  Skin:    General: Skin is warm and dry.  Neurological:     Mental Status: She is alert and oriented to person, place, and time.  Psychiatric:        Behavior: Behavior is cooperative.      ED Treatments / Results  Labs (all labs ordered are listed, but only abnormal results are displayed) Labs Reviewed - No data to display  EKG None  Radiology Dg Chest 2 View  Result Date: 02/01/2018 CLINICAL DATA:  Cough and chest pain for 1 week. EXAM: CHEST - 2 VIEW COMPARISON:  PA and lateral chest 08/14/2013. FINDINGS: Lungs clear. Heart size normal. No pneumothorax or pleural fluid. No bony abnormality. IMPRESSION: Negative chest. Electronically Signed   By: Drusilla Kanner M.D.   On: 02/01/2018 18:48    Procedures Procedures (including critical care  time)  Medications Ordered in ED Medications - No data to display   Initial Impression / Assessment and Plan / ED Course  I have reviewed the triage vital signs and the nursing notes.  Pertinent labs & imaging results that were available during my care of the patient were reviewed by me and considered in my medical  decision making (see chart for details).    Patient with cough and cold symptoms x 1 week. She reports no relieve with over the counter medication.  She chest 2 view obtained to rule out any pneumonia.  DG chest 2 view was negative for any consultation, pneumonia, pneumothorax.  Has a cough no lymphedema, no sore throat at this time low suspicion for strep pharyngitis.  At this time I will treat patient with Jerilynn Som along with some over-the-counter Mucinex or Robitussin.  She is advised to follow-up with PCP as needed.  Return precautions provided.  Final Clinical Impressions(s) / ED Diagnoses   Final diagnoses:  Viral URI with cough    ED Discharge Orders         Ordered    benzonatate (TESSALON) 100 MG capsule  Every 8 hours     02/01/18 1855           Claude Manges, PA-C 02/01/18 1856    Maia Plan, MD 02/01/18 2015

## 2018-02-01 NOTE — ED Triage Notes (Signed)
The pt is clo a cough and cold for over a week productive green sputum chest congestion anterior and posteriorno temp  lmp yesterday

## 2018-02-01 NOTE — ED Notes (Signed)
Pt stable, ambulatory, states understanding of discharge instructions 

## 2018-12-03 ENCOUNTER — Emergency Department (HOSPITAL_COMMUNITY): Payer: Medicaid Other

## 2018-12-03 ENCOUNTER — Other Ambulatory Visit: Payer: Self-pay

## 2018-12-03 ENCOUNTER — Encounter (HOSPITAL_COMMUNITY): Payer: Self-pay

## 2018-12-03 ENCOUNTER — Emergency Department (HOSPITAL_COMMUNITY)
Admission: EM | Admit: 2018-12-03 | Discharge: 2018-12-03 | Disposition: A | Discharge status: Home / Self Care | Payer: Medicaid Other | Attending: Emergency Medicine | Admitting: Emergency Medicine

## 2018-12-03 DIAGNOSIS — M542 Cervicalgia: Secondary | ICD-10-CM | POA: Insufficient documentation

## 2018-12-03 DIAGNOSIS — Y999 Unspecified external cause status: Secondary | ICD-10-CM | POA: Insufficient documentation

## 2018-12-03 DIAGNOSIS — S60222A Contusion of left hand, initial encounter: Secondary | ICD-10-CM | POA: Insufficient documentation

## 2018-12-03 DIAGNOSIS — Y929 Unspecified place or not applicable: Secondary | ICD-10-CM | POA: Insufficient documentation

## 2018-12-03 DIAGNOSIS — Y939 Activity, unspecified: Secondary | ICD-10-CM | POA: Diagnosis not present

## 2018-12-03 DIAGNOSIS — S0993XA Unspecified injury of face, initial encounter: Secondary | ICD-10-CM | POA: Diagnosis present

## 2018-12-03 DIAGNOSIS — R0789 Other chest pain: Secondary | ICD-10-CM | POA: Diagnosis not present

## 2018-12-03 DIAGNOSIS — I10 Essential (primary) hypertension: Secondary | ICD-10-CM | POA: Insufficient documentation

## 2018-12-03 DIAGNOSIS — S0003XA Contusion of scalp, initial encounter: Secondary | ICD-10-CM | POA: Insufficient documentation

## 2018-12-03 DIAGNOSIS — Z79899 Other long term (current) drug therapy: Secondary | ICD-10-CM | POA: Diagnosis not present

## 2018-12-03 MED ORDER — MELOXICAM 7.5 MG PO TABS: 7.5000 mg | ORAL_TABLET | Freq: Every day | ORAL | 0 refills | Status: AC

## 2018-12-03 NOTE — ED Notes (Signed)
[employer] officer with the patient.
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
## 2018-12-03 NOTE — Discharge Instructions (Addendum)
Your CT imaging and x-rays showed no fractures or concerning acute findings today.   Please return if you have any new or concerning symptoms.  Please follow-up with your primary care provider in the next 2 to 3 days for follow-up from today's visit.  Your blood pressure was elevated today.   I have prescribed you meloxicam 7.5 mg which she can use.  However, he can also use ibuprofen 600 mg 3 times a day as well as Tylenol use as suggested on the back of the bottle.  Tylenol ibuprofen in conjunction worked very well for pain control.  Use cold compresses to your left jaw and ribs.
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
## 2019-09-27 ENCOUNTER — Encounter (HOSPITAL_COMMUNITY): Payer: Self-pay

## 2019-09-27 ENCOUNTER — Emergency Department (HOSPITAL_COMMUNITY)
Admission: EM | Admit: 2019-09-27 | Discharge: 2019-09-27 | Disposition: A | Discharge status: Home / Self Care | Payer: Medicaid Other | Attending: Emergency Medicine | Admitting: Emergency Medicine

## 2019-09-27 ENCOUNTER — Emergency Department (HOSPITAL_COMMUNITY): Payer: Medicaid Other

## 2019-09-27 ENCOUNTER — Other Ambulatory Visit: Payer: Self-pay

## 2019-09-27 DIAGNOSIS — I10 Essential (primary) hypertension: Secondary | ICD-10-CM | POA: Insufficient documentation

## 2019-09-27 DIAGNOSIS — R072 Precordial pain: Secondary | ICD-10-CM | POA: Insufficient documentation

## 2019-09-27 DIAGNOSIS — R079 Chest pain, unspecified: Secondary | ICD-10-CM

## 2019-09-27 LAB — TROPONIN I (HIGH SENSITIVITY)
Troponin I (High Sensitivity): 3 ng/L (ref <18)
Troponin I (High Sensitivity): <2 ng/L (ref <18)

## 2019-09-27 LAB — I-STAT BETA HCG BLOOD, ED (MC, WL, AP ONLY): I-stat hCG, quantitative: <5 m[IU]/mL (ref <5)

## 2019-09-27 LAB — BASIC METABOLIC PANEL
Anion gap: 10 (ref 5–15)
BUN: 12 mg/dL (ref 6–20)
CO2: 26 mmol/L (ref 22–32)
Calcium: 9.4 mg/dL (ref 8.9–10.3)
Chloride: 104 mmol/L (ref 98–111)
Creatinine, Ser: 0.77 mg/dL (ref 0.44–1.00)
GFR calc Af Amer: >60 mL/min (ref >60)
GFR calc non Af Amer: >60 mL/min (ref >60)
Glucose, Bld: 87 mg/dL (ref 70–99)
Potassium: 4.6 mmol/L (ref 3.5–5.1)
Sodium: 140 mmol/L (ref 135–145)

## 2019-09-27 LAB — CBC
HCT: 38.8 % (ref 36.0–46.0)
Hemoglobin: 12.3 g/dL (ref 12.0–15.0)
MCH: 30.1 pg (ref 26.0–34.0)
MCHC: 31.7 g/dL (ref 30.0–36.0)
MCV: 94.9 fL (ref 80.0–100.0)
Platelets: 314 10*3/uL (ref 150–400)
RBC: 4.09 MIL/uL (ref 3.87–5.11)
RDW: 14.1 % (ref 11.5–15.5)
WBC: 9.4 10*3/uL (ref 4.0–10.5)
nRBC: 0 % (ref 0.0–0.2)

## 2019-09-27 MED ORDER — AMLODIPINE BESYLATE 5 MG PO TABS
10.0000 mg | ORAL_TABLET | Freq: Once | ORAL | Status: AC
  Administered 2019-09-27: 10 mg via ORAL
  Filled 2019-09-27: qty 2

## 2019-09-27 MED ORDER — IBUPROFEN 600 MG PO TABS: 600.0000 mg | ORAL_TABLET | Freq: Three times a day (TID) | ORAL | 0 refills | Status: AC

## 2019-09-27 MED ORDER — HYDROCHLOROTHIAZIDE 25 MG PO TABS: 25.0000 mg | ORAL_TABLET | Freq: Every day | ORAL | 0 refills | Status: AC

## 2019-09-27 MED ORDER — IBUPROFEN 200 MG PO TABS
600.0000 mg | ORAL_TABLET | Freq: Once | ORAL | Status: AC
  Administered 2019-09-27: 600 mg via ORAL
  Filled 2019-09-27: qty 3

## 2019-09-27 MED ORDER — HYDROCHLOROTHIAZIDE 12.5 MG PO CAPS
25.0000 mg | ORAL_CAPSULE | Freq: Once | ORAL | Status: AC
  Administered 2019-09-27: 25 mg via ORAL
  Filled 2019-09-27: qty 2

## 2019-09-27 MED ORDER — AMLODIPINE BESYLATE 10 MG PO TABS: 10.0000 mg | ORAL_TABLET | Freq: Every day | ORAL | 0 refills | Status: AC

## 2019-09-27 MED ORDER — SODIUM CHLORIDE 0.9% FLUSH
3.0000 mL | Freq: Once | INTRAVENOUS | Status: AC
  Administered 2019-09-27: 3 mL via INTRAVENOUS

## 2019-09-27 NOTE — ED Triage Notes (Signed)
Patient c/o mid chest pain and pain is worse with a deep breath since yesterday. Patient denies N/v or diaphoresis.

## 2019-09-27 NOTE — ED Provider Notes (Signed)
[facility]-EMERGENCY DEPT Provider Note   CSN: [id number] Arrival date & time: 09/27/19  5573     History Chief Complaint  Patient presents with  . Chest Pain    Whitney Todd is a 29 y.o. female with PMHx HTN (not currently on blood pressure meds) who presents to the ED today with complaint of gradual onset, constant, sharp, substernal chest pain that began yesterday around 12 PM. Pt reports she was at work when the pain began. She denies any other symptoms. She has not taken anything for pain. Per triage report pt stated worsening pain with deep inspiration however during exam she reports worsening pain with movement and laying flat. She reports the pain is relieved with sitting upright. Pt denies recent URI like symptoms. Denies fevers, chills, cough, SOB, hemoptysis, nausea, vomiting, diaphoresis, unilateral leg swelling, or any other associated symptoms.   No hx DVT/PE. No recent prolonged travel or immobilization. No exogenous hormone use. No active malignancy.   The history is provided by the patient and medical records.       Past Medical History:  Diagnosis Date  . Headache    1-2 x/week  . Hypertension    states under control with med., has been on med. x 1 month    Patient Active Problem List   Diagnosis Date Noted  . Preeclampsia 04/01/2017  . NSVD (normal spontaneous vaginal delivery) 03/14/2016  . Hypertension complicating pregnancy 07/12/2014  . Gestational hypertension, antepartum 06/23/2014  . 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 Insufficient prenatal care in third trimester 06/02/2014    Past Surgical History:  Procedure Laterality Date  . TOOTH EXTRACTION       OB History    Gravida  5   Para  5   Term  4   Preterm  0   AB  0   Living  5     SAB  0   TAB  0   Ectopic  0   Multiple  0   Live Births  5           Family History  Problem Relation Age of Onset  . Hypertension Mother   .  Diabetes Father   . Hypertension Father   . Diabetes Paternal Grandmother     Social History   Tobacco Use  . Smoking status: Never Smoker  . Smokeless tobacco: Never Used  Vaping Use  . Vaping Use: Never used  Substance Use Topics  . Alcohol use: Yes    Comment: occasionally  . Drug use: Yes    Types: Marijuana    Comment: last used 1 month ago    Home Medications Prior to Admission medications   Medication Sig Start Date End Date Taking? Authorizing Provider  amLODipine (NORVASC) 10 MG tablet Take 1 tablet (10 mg total) by mouth daily. Patient not taking: Reported on 09/27/2019 04/04/17   Constant, Peggy, MD  amLODipine (NORVASC) 10 MG tablet Take 1 tablet (10 mg total) by mouth daily. 09/27/19 10/27/19  Tanda Rockers, PA-C  hydrochlorothiazide (HYDRODIURIL) 25 MG tablet Take 1 tablet (25 mg total) by mouth daily. Patient not taking: Reported on 09/27/2019 04/04/17   Constant, Peggy, MD  hydrochlorothiazide (HYDRODIURIL) 25 MG tablet Take 1 tablet (25 mg total) by mouth daily. 09/27/19 10/27/19  Tanda Rockers, PA-C  ibuprofen (ADVIL) 600 MG tablet Take 1 tablet (600 mg total) by mouth 3 (three) times daily. 09/27/19   Hyman Hopes, Saranne Crislip, PA-C  ibuprofen (ADVIL,MOTRIN) 600 MG  tablet Take 1 tablet (600 mg total) by mouth every 6 (six) hours. Patient not taking: Reported on 09/27/2019 04/04/17   Constant, Peggy, MD    Allergies    Patient has no known allergies.  Review of Systems   Review of Systems  Constitutional: Negative for chills and fever.  Respiratory: Negative for cough and shortness of breath.   Cardiovascular: Positive for chest pain. Negative for palpitations and leg swelling.  Gastrointestinal: Negative for abdominal pain, nausea and vomiting.  All other systems reviewed and are negative.   Physical Exam Updated Vital Signs BP (S) (!) 177/115 (BP Location: Right Arm)   Pulse 90   Temp 98.4 F (36.9 C) (Oral)   Resp 13   Ht 5\' 10"  (1.778 m)   Wt 106.6 kg   LMP  09/27/2019   SpO2 100%   BMI 33.72 kg/m   Physical Exam Vitals and nursing note reviewed.  Constitutional:      Appearance: She is not ill-appearing or diaphoretic.  HENT:     Head: Normocephalic and atraumatic.  Eyes:     Conjunctiva/sclera: Conjunctivae normal.  Cardiovascular:     Rate and Rhythm: Normal rate and regular rhythm.     Pulses:          Radial pulses are 2+ on the right side and 2+ on the left side.       Dorsalis pedis pulses are 2+ on the right side and 2+ on the left side.  Pulmonary:     Effort: Pulmonary effort is normal.     Breath sounds: Normal breath sounds. No decreased breath sounds, wheezing, rhonchi or rales.  Chest:     Chest wall: Tenderness present.    Abdominal:     Palpations: Abdomen is soft.     Tenderness: There is no abdominal tenderness. There is no guarding or rebound.  Musculoskeletal:     Cervical back: Neck supple.  Skin:    General: Skin is warm and dry.  Neurological:     Mental Status: She is alert.     ED Results / Procedures / Treatments   Labs (all labs ordered are listed, but only abnormal results are displayed) Labs Reviewed  BASIC METABOLIC PANEL  CBC  I-STAT BETA HCG BLOOD, ED ([facility], [facility], [facility] ONLY)  TROPONIN I (HIGH SENSITIVITY)  TROPONIN I (HIGH SENSITIVITY)    EKG EKG Interpretation  Date/Time:  Monday September 27 2019 09:05:58 EDT Ventricular Rate:  89 PR Interval:    QRS Duration: 100 QT Interval:  384 QTC Calculation: 468 R Axis:   70 Text Interpretation: Sinus rhythm Prolonged PR interval Biatrial enlargement No old tracing to compare Confirmed by 12-31-2005 [phone]) on 09/27/2019 9:19:35 AM   Radiology DG Chest 2 View  Result Date: 09/27/2019 CLINICAL DATA:  Chest pain. EXAM: CHEST - 2 VIEW COMPARISON:  12/03/2018 FINDINGS: Midline trachea. Borderline cardiomegaly. Mediastinal contours otherwise within normal limits. No pleural effusion or pneumothorax. Clear lungs. IMPRESSION: Borderline cardiomegaly,  without acute disease. Electronically Signed   By: 12/05/2018 M.D.   On: 09/27/2019 10:48    Procedures Procedures (including critical care time)  Medications Ordered in ED Medications  sodium chloride flush (NS) 0.9 % injection 3 mL (3 mLs Intravenous Given 09/27/19 1125)  hydrochlorothiazide (MICROZIDE) capsule 25 mg (25 mg Oral Given 09/27/19 1123)  amLODipine (NORVASC) tablet 10 mg (10 mg Oral Given 09/27/19 1123)  ibuprofen (ADVIL) tablet 600 mg (600 mg Oral Given 09/27/19 1123)    ED  Course  I have reviewed the triage vital signs and the nursing notes.  Pertinent labs & imaging results that were available during my care of the patient were reviewed by me and considered in my medical decision making (see chart for details).  Clinical Course as of Sep 26 1304  Mon Sep 27, 2019  1258 BP(!): 179/105 [MV]    Clinical Course User Index [MV] Tanda RockersVenter, Josie [place], [place]PA-C   MDM Rules/Calculators/A&P                          29 year old female presents to the ED today complaining of substernal chest pain that began yesterday around 12 PM.  No other complaints.  The pain is exacerbated with lying flat and movement of any type.  No trauma to the chest.  On arrival to the ED patient is afebrile, nontachycardic and nontachypneic.  Peers to be in no acute distress.  Blood pressure is noted to be elevated at 177/115.  Patient does have history of high blood pressure, she states that she used to be on medications only when she was pregnant.  She states that no one has mentioned to her that she was supposed to be on blood pressure medication after pregnancy 2 years ago.  It does appear we have seen her multiple times in the ED since then with noted elevated blood pressure however she had reported at that time that she was taking her blood pressure medication.  It appears she is supposed be on 10 mg amlodipine and 25 HCTZ.  Will provide today, patient will need to start back on it.  Starting her chest pain she  does have tenderness palpation along her chest wall.  She has no risk factors for PE and is PERC negative.  Question if pain is related to pericarditis vs musculoskeletal type pain; pt is TTP on her chest wall however does report pain improved with sitting up. Will obtain EKG, chest x-ray, troponin, screening labs at this time.   EKG with prolonged PR interval and biatrial enlargement; no old to compare to CXR with cardiomegaly; no other findings CBC without leukocytosis. Hgb stable at 12.3.  BMP without electrolyte abnormalities Troponin 3 and then < 2  Pt received her regular  BP meds with decrease in BP to 179/105 (from highest of 232/116). Have instructed on importance of compliance of blood pressure control. [facility]; 1 month Rx given for patient. Will treat with NSAIDs at this time for her chest pain as this will cover for both msk pain and pericarditis. Have discussed this with attending physician Dr. Lynelle DoctorKnapp who agrees with plan. Pt instructed on strict return precautions; she is in agreement with plan and stable for discharge home.   This note was prepared using Dragon voice recognition software and may include unintentional dictation errors due to the inherent limitations of voice recognition software.   Final Clinical Impression(s) / ED Diagnoses Final diagnoses:  Chest pain, unspecified type  Essential hypertension    Rx / DC Orders ED Discharge Orders         Ordered    hydrochlorothiazide (HYDRODIURIL) 25 MG tablet  Daily     Discontinue  Reprint     09/27/19 1303    amLODipine (NORVASC) 10 MG tablet  Daily     Discontinue  Reprint     09/27/19 1303    ibuprofen (ADVIL) 600 MG tablet  3  times daily     Discontinue  Reprint     09/27/19 1303           Discharge Instructions     Please follow up with [facility] to establish care so that they can take over your blood pressure management. I have  prescribed a months worth of medication for you - take everyday. I would recommend checking your blood pressure everyday and keeping a log to take with you to your next family medicine appointment once you have established care with them.   I have prescribed Ibuprofen for you to take for your chest pain - take as prescribed for the next week.   Return to the ED IMMEDIATELY for any worsening chest pain, shortness of breath, fevers > 100.4, or any other new/concerning symptoms       Tanda Rockers, PA-C 09/27/19 1306    Linwood Dibbles, MD 09/27/19 1601

## 2019-09-27 NOTE — Discharge Instructions (Signed)
Please follow up with [facility] and [facility] to establish care so that they can take over your blood pressure management. I have prescribed a months worth of medication for you - take everyday. I would recommend checking your blood pressure everyday and keeping a log to take with you to your next family medicine appointment once you have established care with them.   I have prescribed Ibuprofen for you to take for your chest pain - take as prescribed for the next week.   Return to the ED IMMEDIATELY for any worsening chest pain, shortness of breath, fevers > 100.4, or any other new/concerning symptoms
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
## 2020-09-09 IMAGING — CR DG CHEST 2V
2 series · 2 of 2 positions shown · non-contrast
Comparison: 12/03/2018

CLINICAL DATA: Chest pain.

EXAM:
CHEST - 2 VIEW

[w chest pa]
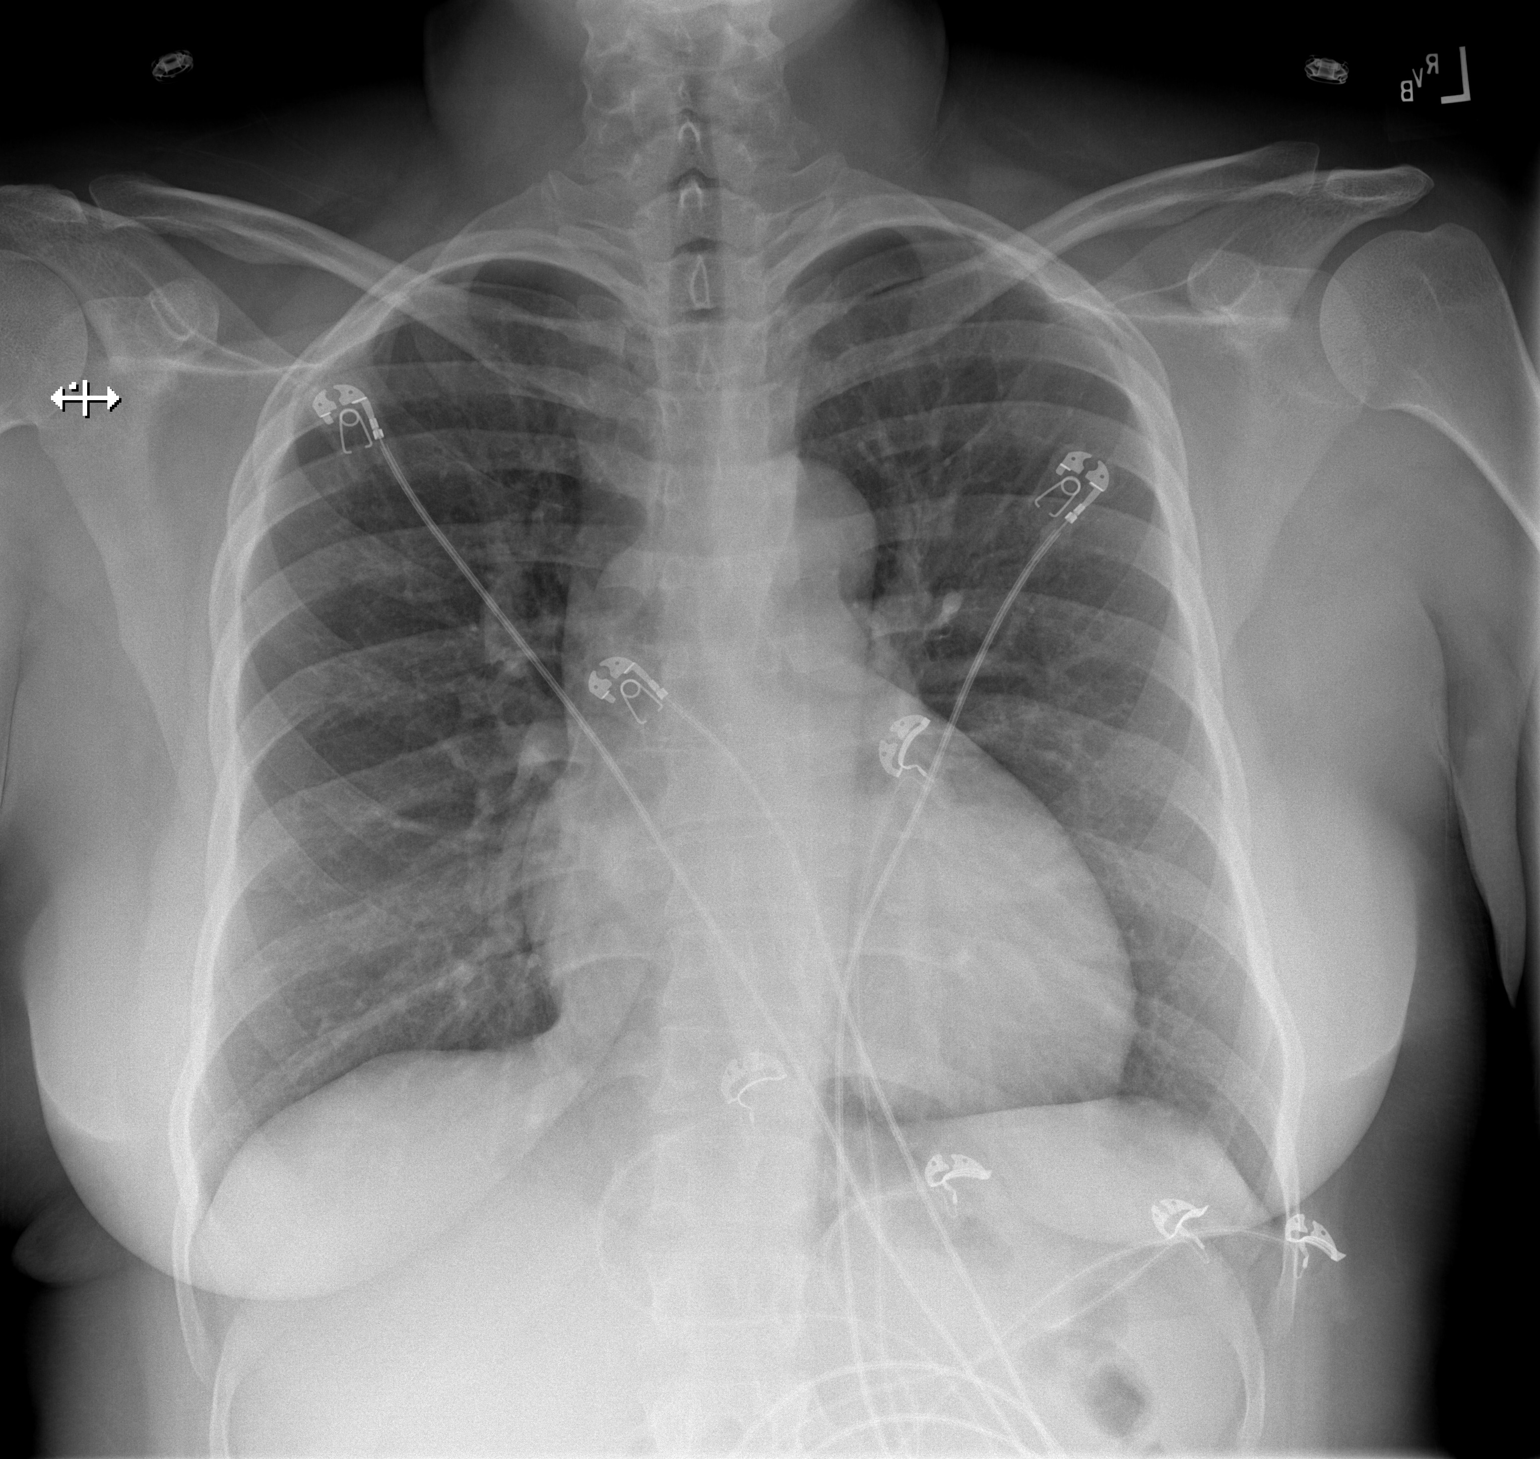

[w chest lat]
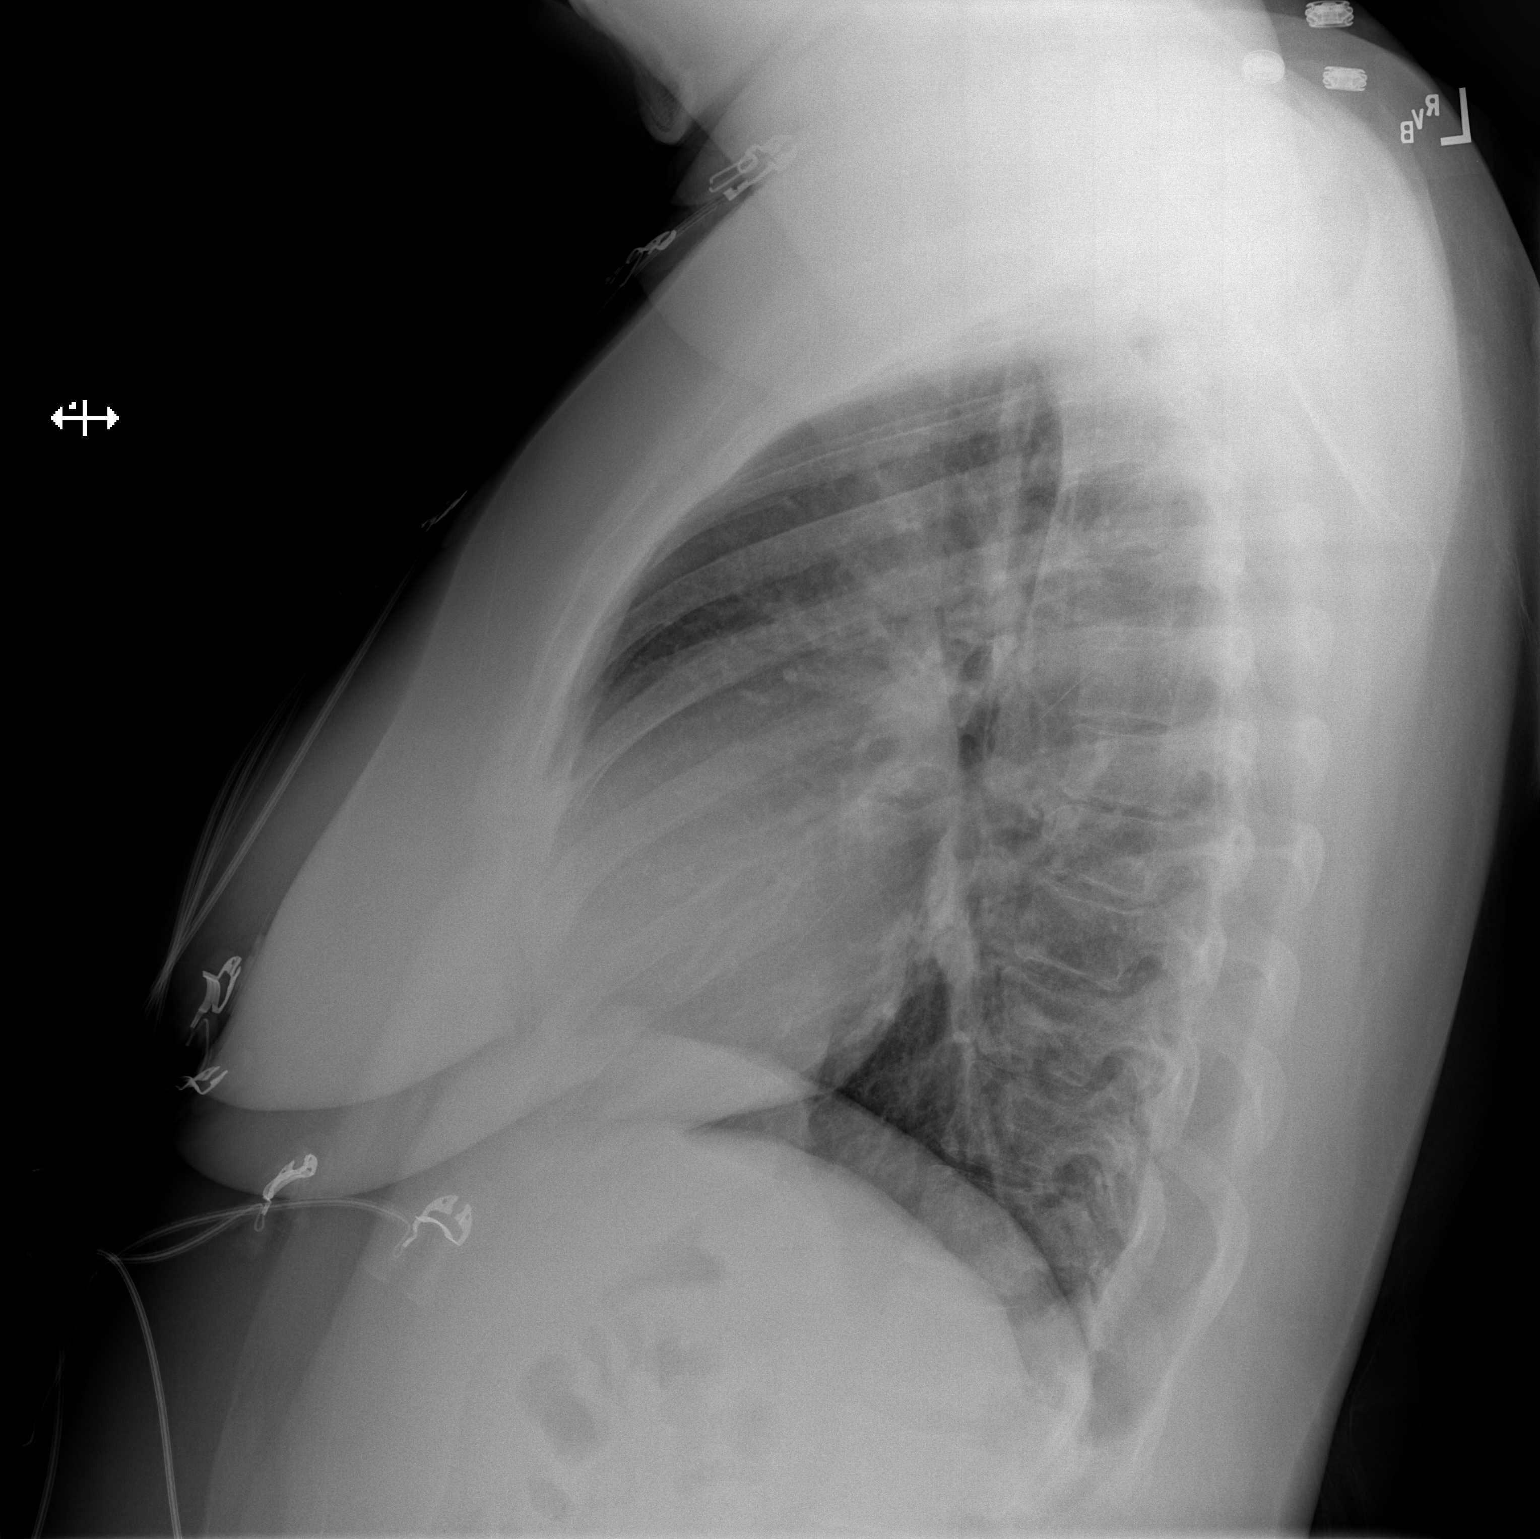

[2 of 2 positions shown; findings below may reference images not displayed]

FINDINGS: Midline trachea. Borderline cardiomegaly. Mediastinal contours
otherwise within normal limits. No pleural effusion or pneumothorax.
Clear lungs.
IMPRESSION: Borderline cardiomegaly, without acute disease.
